# Patient Record
Sex: Male | Born: 1950 | Race: White | Hispanic: No | Marital: Single | Smoking: Former smoker
Health system: Southern US, Community
[De-identification: ages and names within clinical notes are randomized; demographics above are authoritative.]

## PROBLEM LIST (undated history)

## (undated) DIAGNOSIS — A419 Sepsis, unspecified organism: Secondary | ICD-10-CM

## (undated) DIAGNOSIS — C801 Malignant (primary) neoplasm, unspecified: Secondary | ICD-10-CM

## (undated) DIAGNOSIS — K7460 Unspecified cirrhosis of liver: Secondary | ICD-10-CM

## (undated) DIAGNOSIS — B1910 Unspecified viral hepatitis B without hepatic coma: Secondary | ICD-10-CM

---

## 2013-06-07 DIAGNOSIS — A419 Sepsis, unspecified organism: Secondary | ICD-10-CM

## 2013-06-07 HISTORY — DX: Sepsis, unspecified organism: A41.9

## 2013-11-03 ENCOUNTER — Encounter (HOSPITAL_COMMUNITY): Payer: Self-pay | Admitting: Emergency Medicine

## 2013-11-03 ENCOUNTER — Emergency Department (HOSPITAL_COMMUNITY): Payer: Medicaid Other

## 2013-11-03 ENCOUNTER — Inpatient Hospital Stay (HOSPITAL_COMMUNITY): Payer: Medicaid Other

## 2013-11-03 ENCOUNTER — Inpatient Hospital Stay (HOSPITAL_COMMUNITY)
Admission: EM | Admit: 2013-11-03 | Discharge: 2013-11-14 | DRG: 871 | Disposition: A | Discharge status: Other Acute Inpatient Hospital | Payer: Medicaid Other | Attending: Pulmonary Disease | Admitting: Pulmonary Disease

## 2013-11-03 DIAGNOSIS — IMO0002 Reserved for concepts with insufficient information to code with codable children: Secondary | ICD-10-CM | POA: Diagnosis not present

## 2013-11-03 DIAGNOSIS — Z8619 Personal history of other infectious and parasitic diseases: Secondary | ICD-10-CM

## 2013-11-03 DIAGNOSIS — J9819 Other pulmonary collapse: Secondary | ICD-10-CM | POA: Diagnosis present

## 2013-11-03 DIAGNOSIS — R6520 Severe sepsis without septic shock: Secondary | ICD-10-CM

## 2013-11-03 DIAGNOSIS — R7881 Bacteremia: Secondary | ICD-10-CM

## 2013-11-03 DIAGNOSIS — E861 Hypovolemia: Secondary | ICD-10-CM | POA: Diagnosis present

## 2013-11-03 DIAGNOSIS — R092 Respiratory arrest: Secondary | ICD-10-CM | POA: Diagnosis not present

## 2013-11-03 DIAGNOSIS — K8020 Calculus of gallbladder without cholecystitis without obstruction: Secondary | ICD-10-CM | POA: Diagnosis present

## 2013-11-03 DIAGNOSIS — R142 Eructation: Secondary | ICD-10-CM

## 2013-11-03 DIAGNOSIS — J90 Pleural effusion, not elsewhere classified: Secondary | ICD-10-CM | POA: Diagnosis present

## 2013-11-03 DIAGNOSIS — R141 Gas pain: Secondary | ICD-10-CM | POA: Diagnosis present

## 2013-11-03 DIAGNOSIS — K922 Gastrointestinal hemorrhage, unspecified: Secondary | ICD-10-CM

## 2013-11-03 DIAGNOSIS — I319 Disease of pericardium, unspecified: Secondary | ICD-10-CM | POA: Diagnosis present

## 2013-11-03 DIAGNOSIS — R5381 Other malaise: Secondary | ICD-10-CM | POA: Diagnosis present

## 2013-11-03 DIAGNOSIS — C9111 Chronic lymphocytic leukemia of B-cell type in remission: Secondary | ICD-10-CM | POA: Diagnosis present

## 2013-11-03 DIAGNOSIS — R188 Other ascites: Secondary | ICD-10-CM | POA: Diagnosis present

## 2013-11-03 DIAGNOSIS — D61818 Other pancytopenia: Secondary | ICD-10-CM

## 2013-11-03 DIAGNOSIS — E43 Unspecified severe protein-calorie malnutrition: Secondary | ICD-10-CM

## 2013-11-03 DIAGNOSIS — B1910 Unspecified viral hepatitis B without hepatic coma: Secondary | ICD-10-CM | POA: Diagnosis present

## 2013-11-03 DIAGNOSIS — A419 Sepsis, unspecified organism: Secondary | ICD-10-CM

## 2013-11-03 DIAGNOSIS — K769 Liver disease, unspecified: Secondary | ICD-10-CM | POA: Diagnosis present

## 2013-11-03 DIAGNOSIS — N170 Acute kidney failure with tubular necrosis: Secondary | ICD-10-CM | POA: Diagnosis present

## 2013-11-03 DIAGNOSIS — A4159 Other Gram-negative sepsis: Principal | ICD-10-CM | POA: Diagnosis present

## 2013-11-03 DIAGNOSIS — K7460 Unspecified cirrhosis of liver: Secondary | ICD-10-CM | POA: Diagnosis present

## 2013-11-03 DIAGNOSIS — E162 Hypoglycemia, unspecified: Secondary | ICD-10-CM | POA: Diagnosis present

## 2013-11-03 DIAGNOSIS — K658 Other peritonitis: Secondary | ICD-10-CM | POA: Diagnosis present

## 2013-11-03 DIAGNOSIS — D689 Coagulation defect, unspecified: Secondary | ICD-10-CM

## 2013-11-03 DIAGNOSIS — D6959 Other secondary thrombocytopenia: Secondary | ICD-10-CM | POA: Diagnosis present

## 2013-11-03 DIAGNOSIS — R143 Flatulence: Secondary | ICD-10-CM

## 2013-11-03 DIAGNOSIS — Z87891 Personal history of nicotine dependence: Secondary | ICD-10-CM | POA: Diagnosis not present

## 2013-11-03 DIAGNOSIS — R5383 Other fatigue: Secondary | ICD-10-CM | POA: Diagnosis not present

## 2013-11-03 DIAGNOSIS — R579 Shock, unspecified: Secondary | ICD-10-CM

## 2013-11-03 DIAGNOSIS — R6521 Severe sepsis with septic shock: Secondary | ICD-10-CM

## 2013-11-03 HISTORY — DX: Unspecified viral hepatitis B without hepatic coma: B19.10

## 2013-11-03 HISTORY — DX: Unspecified cirrhosis of liver: K74.60

## 2013-11-03 HISTORY — DX: Malignant (primary) neoplasm, unspecified: C80.1

## 2013-11-03 LAB — URINALYSIS, ROUTINE W REFLEX MICROSCOPIC
BILIRUBIN URINE: NEGATIVE
Glucose, UA: NEGATIVE mg/dL
Hgb urine dipstick: NEGATIVE
Ketones, ur: NEGATIVE mg/dL
LEUKOCYTES UA: NEGATIVE
NITRITE: NEGATIVE
PH: 6.5 (ref 5.0–8.0)
Protein, ur: NEGATIVE mg/dL
Specific Gravity, Urine: 1.006 (ref 1.005–1.030)
UROBILINOGEN UA: 1 mg/dL (ref 0.0–1.0)

## 2013-11-03 LAB — COMPREHENSIVE METABOLIC PANEL
ALT: 20 U/L (ref 0–53)
AST: 50 U/L — ABNORMAL HIGH (ref 0–37)
Albumin: 1.6 g/dL — ABNORMAL LOW (ref 3.5–5.2)
Alkaline Phosphatase: 53 U/L (ref 39–117)
BUN: 40 mg/dL — ABNORMAL HIGH (ref 6–23)
CO2: 25 mEq/L (ref 19–32)
Calcium: 7.6 mg/dL — ABNORMAL LOW (ref 8.4–10.5)
Chloride: 87 mEq/L — ABNORMAL LOW (ref 96–112)
Creatinine, Ser: 1.47 mg/dL — ABNORMAL HIGH (ref 0.50–1.35)
GFR calc Af Amer: 56 mL/min — ABNORMAL LOW (ref >90)
GFR calc non Af Amer: 49 mL/min — ABNORMAL LOW (ref >90)
Glucose, Bld: 67 mg/dL — ABNORMAL LOW (ref 70–99)
Potassium: 4.5 mEq/L (ref 3.7–5.3)
Sodium: 130 mEq/L — ABNORMAL LOW (ref 137–147)
Total Bilirubin: 3.1 mg/dL — ABNORMAL HIGH (ref 0.3–1.2)
Total Protein: 3.5 g/dL — ABNORMAL LOW (ref 6.0–8.3)

## 2013-11-03 LAB — CBC WITH DIFFERENTIAL/PLATELET
Basophils Absolute: 0 10*3/uL (ref 0.0–0.1)
Basophils Relative: 1 % (ref 0–1)
Eosinophils Absolute: 0 10*3/uL (ref 0.0–0.7)
Eosinophils Relative: 0 % (ref 0–5)
HCT: 45.5 % (ref 39.0–52.0)
Hemoglobin: 15.8 g/dL (ref 13.0–17.0)
Lymphocytes Relative: 8 % — ABNORMAL LOW (ref 12–46)
Lymphs Abs: 0.2 10*3/uL — ABNORMAL LOW (ref 0.7–4.0)
MCH: 31.8 pg (ref 26.0–34.0)
MCHC: 34.7 g/dL (ref 30.0–36.0)
MCV: 91.5 fL (ref 78.0–100.0)
Monocytes Absolute: 0.5 10*3/uL (ref 0.1–1.0)
Monocytes Relative: 24 % — ABNORMAL HIGH (ref 3–12)
Neutro Abs: 1.4 10*3/uL — ABNORMAL LOW (ref 1.7–7.7)
Neutrophils Relative %: 67 % (ref 43–77)
Platelets: 102 10*3/uL — ABNORMAL LOW (ref 150–400)
RBC: 4.97 MIL/uL (ref 4.22–5.81)
RDW: 15.7 % — ABNORMAL HIGH (ref 11.5–15.5)
WBC: 2.1 10*3/uL — ABNORMAL LOW (ref 4.0–10.5)

## 2013-11-03 LAB — CBC
HCT: 39 % (ref 39.0–52.0)
Hemoglobin: 13.7 g/dL (ref 13.0–17.0)
MCH: 32 pg (ref 26.0–34.0)
MCHC: 35.1 g/dL (ref 30.0–36.0)
MCV: 91.1 fL (ref 78.0–100.0)
PLATELETS: 77 10*3/uL — AB (ref 150–400)
RBC: 4.28 MIL/uL (ref 4.22–5.81)
RDW: 16.1 % — ABNORMAL HIGH (ref 11.5–15.5)
WBC: 1.3 10*3/uL — CL (ref 4.0–10.5)

## 2013-11-03 LAB — PHOSPHORUS: PHOSPHORUS: 4.7 mg/dL — AB (ref 2.3–4.6)

## 2013-11-03 LAB — I-STAT CG4 LACTIC ACID, ED: Lactic Acid, Venous: 5.23 mmol/L — ABNORMAL HIGH (ref 0.5–2.2)

## 2013-11-03 LAB — MRSA PCR SCREENING: MRSA by PCR: NEGATIVE

## 2013-11-03 LAB — PROTIME-INR
INR: 1.8 — ABNORMAL HIGH (ref 0.00–1.49)
Prothrombin Time: 20.4 seconds — ABNORMAL HIGH (ref 11.6–15.2)

## 2013-11-03 LAB — TROPONIN I
Troponin I: 1.1 ng/mL (ref <0.30)
Troponin I: 1.29 ng/mL (ref <0.30)

## 2013-11-03 LAB — GLUCOSE, CAPILLARY
GLUCOSE-CAPILLARY: 100 mg/dL — AB (ref 70–99)
Glucose-Capillary: 100 mg/dL — ABNORMAL HIGH (ref 70–99)

## 2013-11-03 LAB — AMMONIA: Ammonia: 28 umol/L (ref 11–60)

## 2013-11-03 LAB — MAGNESIUM: Magnesium: 1.8 mg/dL (ref 1.5–2.5)

## 2013-11-03 LAB — CBG MONITORING, ED
Glucose-Capillary: 53 mg/dL — ABNORMAL LOW (ref 70–99)
Glucose-Capillary: 112 mg/dL — ABNORMAL HIGH (ref 70–99)

## 2013-11-03 LAB — ABO/RH: ABO/RH(D): O NEG

## 2013-11-03 LAB — PREPARE RBC (CROSSMATCH)

## 2013-11-03 LAB — LACTIC ACID, PLASMA
Lactic Acid, Venous: 5.4 mmol/L — ABNORMAL HIGH (ref 0.5–2.2)
Lactic Acid, Venous: 4.3 mmol/L — ABNORMAL HIGH (ref 0.5–2.2)

## 2013-11-03 LAB — PROCALCITONIN: Procalcitonin: 7.05 ng/mL

## 2013-11-03 MED ORDER — PIPERACILLIN-TAZOBACTAM 3.375 G IVPB
3.3750 g | Freq: Three times a day (TID) | INTRAVENOUS | Status: DC
  Administered 2013-11-03 – 2013-11-06 (×8): 3.375 g via INTRAVENOUS
  Filled 2013-11-03 (×10): qty 50

## 2013-11-03 MED ORDER — VANCOMYCIN HCL IN DEXTROSE 1-5 GM/200ML-% IV SOLN
1000.0000 mg | Freq: Once | INTRAVENOUS | Status: AC
  Administered 2013-11-03: 1000 mg via INTRAVENOUS
  Filled 2013-11-03: qty 200

## 2013-11-03 MED ORDER — SODIUM CHLORIDE 0.9 % IV SOLN
50.0000 ug/h | INTRAVENOUS | Status: DC
  Administered 2013-11-03 – 2013-11-04 (×3): 50 ug/h via INTRAVENOUS
  Filled 2013-11-03 (×6): qty 1

## 2013-11-03 MED ORDER — ALBUMIN HUMAN 25 % IV SOLN
25.0000 g | Freq: Once | INTRAVENOUS | Status: AC
  Administered 2013-11-03: 25 g via INTRAVENOUS
  Filled 2013-11-03: qty 100

## 2013-11-03 MED ORDER — DEXTROSE 5 % IV SOLN
2.0000 ug/min | INTRAVENOUS | Status: DC
  Filled 2013-11-03 (×2): qty 4

## 2013-11-03 MED ORDER — DEXTROSE 5 % IV SOLN: 1.0000 g | Freq: Once | INTRAVENOUS | Status: DC

## 2013-11-03 MED ORDER — NOREPINEPHRINE BITARTRATE 1 MG/ML IV SOLN
2.0000 ug/min | Freq: Once | INTRAVENOUS | Status: AC
  Administered 2013-11-03: 2 ug/min via INTRAVENOUS
  Filled 2013-11-03 (×2): qty 4

## 2013-11-03 MED ORDER — CEFEPIME HCL 2 G IJ SOLR: 2.0000 g | INTRAMUSCULAR | Status: DC

## 2013-11-03 MED ORDER — DEXTROSE 5 % IV SOLN: 2.0000 g | INTRAVENOUS | Status: DC

## 2013-11-03 MED ORDER — SODIUM CHLORIDE 0.9 % IV BOLUS (SEPSIS)
2000.0000 mL | Freq: Once | INTRAVENOUS | Status: AC
  Administered 2013-11-03: 2000 mL via INTRAVENOUS

## 2013-11-03 MED ORDER — SODIUM CHLORIDE 0.9 % IV SOLN: INTRAVENOUS | Status: DC

## 2013-11-03 MED ORDER — VANCOMYCIN HCL 500 MG IV SOLR
500.0000 mg | Freq: Two times a day (BID) | INTRAVENOUS | Status: DC
  Administered 2013-11-04 – 2013-11-05 (×3): 500 mg via INTRAVENOUS
  Filled 2013-11-03 (×5): qty 500

## 2013-11-03 MED ORDER — DEXTROSE-NACL 5-0.45 % IV SOLN
INTRAVENOUS | Status: DC
  Administered 2013-11-03 – 2013-11-04 (×2): via INTRAVENOUS

## 2013-11-03 MED ORDER — PIPERACILLIN-TAZOBACTAM 3.375 G IVPB 30 MIN
3.3750 g | Freq: Once | INTRAVENOUS | Status: AC
  Administered 2013-11-03: 3.375 g via INTRAVENOUS
  Filled 2013-11-03: qty 50

## 2013-11-03 MED ORDER — SODIUM CHLORIDE 0.9 % IV SOLN
8.0000 mg/h | INTRAVENOUS | Status: DC
  Administered 2013-11-03 – 2013-11-04 (×3): 8 mg/h via INTRAVENOUS
  Filled 2013-11-03 (×6): qty 80

## 2013-11-03 MED ORDER — PANTOPRAZOLE SODIUM 40 MG IV SOLR
80.0000 mg | Freq: Once | INTRAVENOUS | Status: AC
  Administered 2013-11-03: 80 mg via INTRAVENOUS
  Filled 2013-11-03: qty 80

## 2013-11-03 MED ORDER — OCTREOTIDE LOAD VIA INFUSION
50.0000 ug | Freq: Once | INTRAVENOUS | Status: AC
  Administered 2013-11-03: 50 ug via INTRAVENOUS
  Filled 2013-11-03: qty 25

## 2013-11-03 MED ORDER — SODIUM CHLORIDE 0.9 % IV BOLUS (SEPSIS)
1000.0000 mL | Freq: Once | INTRAVENOUS | Status: AC
  Administered 2013-11-03: 1000 mL via INTRAVENOUS

## 2013-11-03 MED ORDER — DEXTROSE 50 % IV SOLN
INTRAVENOUS | Status: AC
  Administered 2013-11-03: 50 mL
  Filled 2013-11-03: qty 50

## 2013-11-03 NOTE — ED Notes (Signed)
Tyler Huang  And I put in a temp foley

## 2013-11-03 NOTE — ED Notes (Addendum)
Sister Claudius Sis [phone] please call if needed.

## 2013-11-03 NOTE — Progress Notes (Signed)
PRELIMINARY NOTE:  Pt seen briefly, and labs/CT reviewed.  It does not appear this pt has an active GIB to acct for his malaise and hypotension.  Agree w/ mgt per PCCM.    Will see pt in more detail later today.  Call in the meantime if needed.  Cleotis Nipper, M.D. [phone]

## 2013-11-03 NOTE — ED Notes (Signed)
Attempting Second IV and blood draw

## 2013-11-03 NOTE — ED Notes (Signed)
DR. Earnest Conroy at bedside.

## 2013-11-03 NOTE — ED Notes (Signed)
GI Dr at bedside

## 2013-11-03 NOTE — ED Notes (Addendum)
Pt from home via [facility] c/o nausea and vomiting since last night. HX of lukemia, cirrhosis, Hep B. Pt was hypotensive with EMS at 87/66. Pt alert and oriented. Emesis is brown in color. PT denies pain. Pt had 4mg  Zofran en route and 500 mL Bolus

## 2013-11-03 NOTE — ED Notes (Signed)
Phlebotomy at bedside.

## 2013-11-03 NOTE — ED Notes (Signed)
MD at bedside. 

## 2013-11-03 NOTE — ED Notes (Signed)
Phlebotomy called for labs 

## 2013-11-03 NOTE — ED Notes (Signed)
Bed: VZ85 Expected date:  Expected time:  Means of arrival:  Comments: EMS cancer/vomiting/weak

## 2013-11-03 NOTE — ED Notes (Signed)
Notified Dr Wilson Singer lactic acid on Res-b 5.23.Marland KitchenKLJ

## 2013-11-03 NOTE — ED Provider Notes (Signed)
CSN: [id number]     Arrival date & time 11/03/13  0754 History   First MD Initiated Contact with Patient 11/03/13 0800     Chief Complaint  Patient presents with  . Weakness  . Emesis     (Consider location/radiation/quality/duration/timing/severity/associated sxs/prior Treatment) HPI  62yM with generalized weakness and n/v. Pt from near [place], [place]. No old records for review. 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 "It's not from alcohol." Per sister, from hep. B.  Has  gastroenterologist, but not in this area. Reports getting treatment, but cannot provide specifics.  Last night began having n/v. Initially was protein shakes his sister was making for him, but then became black/brown. Feels very weak and off balance. No BRPR or melena. Mild SOB. Denies any acute pain. No varices that he is aware of.    Past Medical History  Diagnosis Date  . Cancer   . Hepatitis B   . Cirrhosis    No past surgical history on file. No family history on file. History  Substance Use Topics  . Smoking status: Former Smoker    Types: Cigarettes  . Smokeless tobacco: Not on file  . Alcohol Use: No    Review of Systems  All systems reviewed and negative, other than as noted in HPI.   Allergies  Review of patient's allergies indicates no known allergies.  Home Medications   Prior to Admission medications   Not on File   BP 70/50  Pulse 102  Resp 16  Ht 6' (1.829 m)  Wt 147 lb (66.679 kg)  BMI 19.93 kg/m2  SpO2 91% Physical Exam  Nursing note and vitals reviewed. Constitutional: He appears distressed.  Cachectic and chronically ill appearing. Frequently spitting up small amount of dark brown/black material.  Eyes: Right eye exhibits no discharge. Left eye exhibits no discharge. Scleral icterus is present.  Neck: Neck supple.  Cardiovascular: Normal rate, regular rhythm and normal heart sounds.  Exam reveals no gallop and  no friction rub.   No murmur heard. Pulmonary/Chest:  Tachypnea. Lung sounds clear.   Abdominal: Soft. He exhibits distension. There is no tenderness.  Soft. Distended. Nontender.   Genitourinary:  Brown colored stool. Heme positive.   Musculoskeletal: He exhibits no edema and no tenderness.  Neurological: He is alert.  Skin: Skin is warm and dry.  Psychiatric: He has a normal mood and affect. His behavior is normal. Thought content normal.    ED Course  Procedures (including critical care time)  CENTRAL LINE Performed by: Virgel Manifold Consent: The procedure was performed in an emergent situation. Required items: required blood products, implants, devices, and special equipment available Patient identity confirmed: arm band and provided demographic data Time out: Immediately prior to procedure a "time out" was called to verify the correct patient, procedure, equipment, support staff and site/side marked as required. Indications: vascular access Anesthesia: local infiltration Local anesthetic: lidocaine 1% w/o epinephrine Anesthetic total: 3 ml Patient sedated: no Preparation: skin prepped with 2% chlorhexidine Skin prep agent dried: skin prep agent completely dried prior to procedure Sterile barriers: all five maximum sterile barriers used - cap, mask, sterile gown, sterile gloves, and large sterile sheet Hand hygiene: hand hygiene performed prior to central venous catheter insertion  Location details: R IJ  Catheter type: triple lumen Catheter size: 8 Fr Pre-procedure: landmarks identified Ultrasound guidance: yes Successful placement: yes. 20cm TLC placed at 15cm. Post-procedure: line sutured and dressing applied Assessment:  blood return through all parts, free fluid flow, placement verified by x-ray and no pneumothorax on x-ray Patient tolerance: Patient tolerated the procedure well with no immediate complications.   CRITICAL CARE Performed by: Virgel Manifold  Total  critical care time: 40 minutes  Critical care time was exclusive of separately billable procedures and treating other patients. Critical care was necessary to treat or prevent imminent or life-threatening deterioration. Critical care was time spent personally by me on the following activities: development of treatment plan with patient and/or surrogate as well as nursing, discussions with consultants, evaluation of patient's response to treatment, examination of patient, obtaining history from patient or surrogate, ordering and performing treatments and interventions, ordering and review of laboratory studies, ordering and review of radiographic studies, pulse oximetry and re-evaluation of patient's condition.   Labs Review Labs Reviewed  CBC WITH DIFFERENTIAL - Abnormal; Notable for the following:    WBC 2.1 (*)    RDW 15.7 (*)    Platelets 102 (*)    All other components within normal limits  COMPREHENSIVE METABOLIC PANEL - Abnormal; Notable for the following:    Sodium 130 (*)    Chloride 87 (*)    Glucose, Bld 67 (*)    BUN 40 (*)    Creatinine, Ser 1.47 (*)    Calcium 7.6 (*)    Total Protein 3.5 (*)    Albumin 1.6 (*)    AST 50 (*)    Total Bilirubin 3.1 (*)    GFR calc non Af Amer 49 (*)    GFR calc Af Amer 56 (*)    All other components within normal limits  PROTIME-INR - Abnormal; Notable for the following:    Prothrombin Time 20.4 (*)    INR 1.80 (*)    All other components within normal limits  CULTURE, BLOOD (ROUTINE X 2)  CULTURE, BLOOD (ROUTINE X 2)  AMMONIA  URINALYSIS, ROUTINE W REFLEX MICROSCOPIC  I-STAT CG4 LACTIC ACID, ED  TYPE AND SCREEN  PREPARE RBC (CROSSMATCH)    Imaging Review Dg Chest Portable 1 View  11/03/2013   CLINICAL DATA:  Status post right jugular line placement  EXAM: PORTABLE CHEST - 1 VIEW  COMPARISON:  11/03/2013  FINDINGS: Cardiac shadow is stable. Poor inspiratory effort is again noted with bibasilar atelectatic changes. A new right  jugular line is noted with the catheter tip in the superior aspect of the right atrium. This is likely somewhat accentuated by the poor inspiratory effort. It could be withdrawn 1 cm as needed. No pneumothorax is noted.  IMPRESSION: Status post central line placement. This could be withdrawn 1 cm for more appropriate placement at the cavoatrial junction.  Bibasilar atelectatic changes.   Electronically Signed   By: Inez Catalina M.D.   On: 11/03/2013 10:50   Dg Chest Portable 1 View  11/03/2013   CLINICAL DATA:  Nausea/vomiting, weakness, cirrhosis  EXAM: PORTABLE CHEST - 1 VIEW  COMPARISON:  None.  FINDINGS: Low lung volumes. Mild left basilar opacity, likely atelectasis. No focal consolidation. No pleural effusion or pneumothorax.  Heart is normal size.  IMPRESSION: Low lung volumes with left basilar atelectasis.   Electronically Signed   By: Julian Hy M.D.   On: 11/03/2013 10:12     EKG Interpretation None      MDM   Final diagnoses:  Upper GI bleed  Cirrhosis  Shock circulatory    63 year old male with nausea vomiting and generalized weakness. Patient appears ill. Hypotensive and mildly tachycardic.  Likely upper GI bleed. Frequently spitting up black material. History of cirrhosis. No known history of varices.  IV access x2 established. IV fluid bolus. Protonix bolus and drip. Type and screen. Will assess response to IV fluids. May potentially need transfusion. No blood thinners, but unclear of synthetic function of liver with his history of cirrhosis. We'll check an INR. GI consultation. Admission.  Remains hypotensive after 3L IVF. PRBCs ordered. Will place central line.   H/H actually came back normal. Wouldn't be surprised if has subsequent drop. Abx ordered for possible sepsis, although clinically probably not. GI, Dr Cristina Gong has been consulted. Recommending starting octreotide and will see in consultation. Will discuss with CCM.   Line placed. Levophed. May benefit from  albumin.   Virgel Manifold, MD 11/08/13 [phone]

## 2013-11-03 NOTE — ED Notes (Signed)
Patient is trying to urinate, but if unable, I will get it once the nurse and I put in a temp foley.

## 2013-11-03 NOTE — ED Notes (Signed)
Patient wants wait and to try to urinate again. Will call once he has urinated

## 2013-11-03 NOTE — Consult Note (Signed)
Referring Provider: Dr. Mila Merry  Primary Care Physician:  Althia Forts Primary Gastroenterologist: Althia Forts  Reason for Consultation:  Cirrhosis and hypotension, questionable bleeding.  HPI: Tyler Huang is a 63 y.o. male admitted through the emergency room today with nausea and vomiting that began early this morning. In the emergency room, he had low blood pressure, in the 60 systolic range, and received several liters of fluid with only modest improvement. He had some dark emesis but no frank blood. His stool from below, in the emergency room, was brown but Hemoccult positive.   The patient has only been living [place] about 6 months, and we have no outside records currently available for review. 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 He was subsequently seen by a gastroenterologist in [place], Dr. Drema Dallas , who has arrange for the patient had a liver transplant evaluation at [facility] on June 19, according to the patient.  With that background, the patient indicates that he has been going steadily downhill since his November hospitalization, with progressive weakness and weight loss as well as a tendency for reaccumulation of his ascites which compresses his stomach and diminishes his appetite. He has been on diuretic therapy.   In the emergency room, his hemoglobin was normal at 14.5, ammonia was normal, his bilirubin was 3.1, his sodium was 130, BUN and creatinine were moderately elevated at 40 and 1.47, respectively, liver chemistries essentially normal, platelets low at 74,000, and CT showed gallstones and changes of cirrhosis with ascites.   Past Medical History  Diagnosis Date  . Cancer   .  Hepatitis B   . Cirrhosis     No past surgical history on file.  Prior to Admission medications   Medication Sig Start Date End Date Taking? Authorizing Provider  predniSONE (DELTASONE) 10 MG tablet Take 10 mg by mouth 2 (two) times a week.   Yes Historical Provider, MD  spironolactone (ALDACTONE) 50 MG tablet Take 50 mg by mouth daily.   Yes Historical Provider, MD  torsemide (DEMADEX) 20 MG tablet Take 20 mg by mouth daily.   Yes Historical Provider, MD    Current Facility-Administered Medications  Medication Dose Route Frequency Provider Last Rate Last Dose  . dextrose 5 %-0.45 % sodium chloride infusion   Intravenous Continuous Doree Fudge, MD 100 mL/hr at 11/03/13 1248    . norepinephrine (LEVOPHED) 4 mg in dextrose 5 % 250 mL infusion  2-50 mcg/min Intravenous Continuous Konstantin Zubelevitskiy, MD      . octreotide (SANDOSTATIN) 2 mcg/mL in sodium chloride 0.9 % 250 mL infusion  50 mcg/hr Intravenous Continuous Virgel Manifold, MD 25 mL/hr at 11/03/13 1051 50 mcg/hr at 11/03/13 1051  . pantoprazole (PROTONIX) 80 mg in sodium chloride 0.9 % 250 mL infusion  8 mg/hr Intravenous Continuous Virgel Manifold, MD 25 mL/hr at 11/03/13 0930 8 mg/hr at 11/03/13 0930  . piperacillin-tazobactam (ZOSYN) IVPB 3.375 g  3.375 g Intravenous [address] [place]      . [START ON 11/04/2013] vancomycin (VANCOCIN) 500 mg in sodium chloride 0.9 % 100 mL IVPB  500 mg Intravenous Q12H [facility]        Allergies as of 11/03/2013  . (No Known Allergies)  No family history on file.  History   Social History  . Marital Status: Single    Spouse Name: N/A    Number of Children: N/A  . Years of Education: N/A   Occupational History  . Not on file.   Social History Main Topics  . Smoking status: Former Smoker    Types: Cigarettes  . Smokeless tobacco: Not on file  . Alcohol Use: No  . Drug Use: No  . Sexual Activity: Not on file   Other Topics Concern  .  Not on file   Social History Narrative  . No narrative on file    Review of Systems:  anorexia, weight loss. No focus of infection such as cough, no dyspnea or chest pain   Physical Exam: Vital signs in last 24 hours: Temp:  [94.1 F (34.5 C)-97.2 F (36.2 C)] 96 F (35.6 C) (05/30 1345) Pulse Rate:  [44-153] 111 (05/30 1500) Resp:  [16-42] 40 (05/30 1500) BP: (65-109)/(18-71) 83/66 mmHg (05/30 1500) SpO2:  [69 %-98 %] 98 % (05/30 1500) Weight:  [66.679 kg (147 lb)] 66.679 kg (147 lb) (05/30 0804)   Of thin, malnourished Caucasian male with multiple ecchymoses on his forearms. No frank jaundice or scleral icterus. No spider angiomata or palmar erythema. No peripheral edema, but moderately severe ascites. Liver span diminished I scratched S., probably 7 cm, liver and spleen nonpalpable, generous ascites present. Appears cognitively intact, no asterixis. Chest clear, heart has rapid rate but no murmur or irregularity of rhythm. No umbilical hernia present. Stool by rectal exam was brown and Hemoccult positive, per the emergency room physician.   Intake/Output from previous day:   Intake/Output this shift:    Lab Results:  Recent Labs  11/03/13 0916 11/03/13 1545  WBC 2.1* 1.3*  HGB 15.8 14.5  HCT 45.5 41.3  PLT 102* 74*   BMET  Recent Labs  11/03/13 0916  NA 130*  K 4.5  CL 87*  CO2 25  GLUCOSE 67*  BUN 40*  CREATININE 1.47*  CALCIUM 7.6*   LFT  Recent Labs  11/03/13 0916  PROT 3.5*  ALBUMIN 1.6*  AST 50*  ALT 20  ALKPHOS 53  BILITOT 3.1*   PT/INR  Recent Labs  11/03/13 0916  LABPROT 20.4*  INR 1.80*    Studies/Results: Ct Abdomen Pelvis Wo Contrast  11/03/2013   CLINICAL DATA:  Nausea and vomiting, hypotension, cirrhosis  EXAM: CT ABDOMEN AND PELVIS WITHOUT CONTRAST  TECHNIQUE: Multidetector CT imaging of the abdomen and pelvis was performed following the standard protocol without IV contrast.  COMPARISON:  None.  FINDINGS: Moderate-sized left  basilar effusion is noted. Associated infiltrate is noted in the left lower lobe.  There are cirrhotic changes of the liver with nodularity and a small size. The spleen is mildly enlarged. Diffuse ascites is noted throughout the abdomen. Few small gallstones are seen. The adrenal glands and kidneys are within normal limits. The pancreas is within normal limits as well. The bladder is well distended. No pelvic mass lesion is noted. The appendix is not well visualized although no inflammatory changes are seen. The osseous structures are grossly unremarkable. The wall the distal esophagus is thickened which may be related to esophageal varices. The lack of IV contrast makes vascular evaluation difficult.  IMPRESSION: Changes consistent with cirrhosis of the liver and portal hypertension. Significant large volume ascites is noted.  Small gallstones without complicating factors.  Left pleural effusion and infiltrate   Electronically Signed   By:  Inez Catalina M.D.   On: 11/03/2013 12:05   Dg Chest Portable 1 View  11/03/2013   CLINICAL DATA:  Status post right jugular line placement  EXAM: PORTABLE CHEST - 1 VIEW  COMPARISON:  11/03/2013  FINDINGS: Cardiac shadow is stable. Poor inspiratory effort is again noted with bibasilar atelectatic changes. A new right jugular line is noted with the catheter tip in the superior aspect of the right atrium. This is likely somewhat accentuated by the poor inspiratory effort. It could be withdrawn 1 cm as needed. No pneumothorax is noted.  IMPRESSION: Status post central line placement. This could be withdrawn 1 cm for more appropriate placement at the cavoatrial junction.  Bibasilar atelectatic changes.   Electronically Signed   By: Inez Catalina M.D.   On: 11/03/2013 10:50   Dg Chest Portable 1 View  11/03/2013   CLINICAL DATA:  Nausea/vomiting, weakness, cirrhosis  EXAM: PORTABLE CHEST - 1 VIEW  COMPARISON:  None.  FINDINGS: Low lung volumes. Mild left basilar opacity, likely  atelectasis. No focal consolidation. No pleural effusion or pneumothorax.  Heart is normal size.  IMPRESSION: Low lung volumes with left basilar atelectasis.   Electronically Signed   By: Julian Hy M.D.   On: 11/03/2013 10:12    Impression:  1. Hypotension, nausea and vomiting, azotemia and electrolyte abnormalities. I question whether this could be volume contraction related to over-diuresis, with nausea and vomiting as a consequence. There is no evidence of clinically significant GI bleeding to account for the patient's hypotension. 2. Heme positive stool, without overt bleeding or anemia. 3. Chronic liver disease with decompensation characterized by ascites and mild hyperbilirubinemia, possibly also by renal dysfunction although that may be acute and reversible.  Plan:  1. Fluid and electrolyte repletion as being performed by PCCM. 2. Empiric octreotide for now, although I think we will be able to discontinue it in the next day or 2 unless evidence of bleeding develops. 3. Agree with antibiotic therapy. It is conceivable this patient has sepsis from some source. Also, he could have SBP, although in my experience, and that is not a cause for sepsis and hypotension. 4. Ultimately, the patient will need liver transplant evaluation and possibly care for his hepatitis B at a transplant center. 5. Prior to discharge, if his volume status is corrected, it might be appropriate to do a therapeutic paracentesis. 6. I see no urgent need for endoscopy and colonoscopy at present, but eventually, they should be performed in view of his heme positivity.    LOS: 0 days   Cleotis Nipper  11/03/2013, 4:33 PM

## 2013-11-03 NOTE — ED Notes (Signed)
CBG registered 112 on ED Glucometer.

## 2013-11-03 NOTE — Progress Notes (Addendum)
ANTIBIOTIC CONSULT NOTE - INITIAL  Pharmacy Consult for vancomycin, Zosyn Indication: rule out sepsis  No Known Allergies  Patient Measurements: Height: 6' (182.9 cm) Weight: 147 lb (66.679 kg) IBW/kg (Calculated) : 77.6  Vital Signs: Temp: 97.2 F (36.2 C) (05/30 0841) Temp src: Rectal (05/30 0841) BP: 80/44 mmHg (05/30 1022) Pulse Rate: 91 (05/30 1022) Intake/Output from previous day:   Intake/Output from this shift:    Labs:  Recent Labs  11/03/13 0916  WBC 2.1*  HGB 15.8  PLT 102*  CREATININE 1.47*   Estimated Creatinine Clearance: 47.9 ml/min (by C-G formula based on Cr of 1.47). No results found for this basename: VANCOTROUGH, VANCOPEAK, VANCORANDOM, GENTTROUGH, GENTPEAK, GENTRANDOM, TOBRATROUGH, TOBRAPEAK, TOBRARND, AMIKACINPEAK, AMIKACINTROU, AMIKACIN,  in the last 72 hours   Microbiology: No results found for this or any previous visit (from the past 720 hour(s)).  Medical History: Past Medical History  Diagnosis Date  . Cancer   . Hepatitis B   . Cirrhosis     Medications:  Scheduled:  . norepinephrine (LEVOPHED) Adult infusion  2-50 mcg/min Intravenous Once  . octreotide  50 mcg Intravenous Once   Infusions:  . octreotide (SANDOSTATIN) infusion    . pantoprozole (PROTONIX) infusion 8 mg/hr (11/03/13 0930)  . sodium chloride    . vancomycin     Assessment: 63 yo male presents to ER with weakness, emesis, hypotension. PMH includes CLL (per H&P last chemo July 2014), cirrhosis, hepatitis B. Patient with likely upper GI bleed per notes as spitting up black material frequently. Also starting vancomycin and Zosyn for possible sepsis per pharmacy dosing. Vancomycin x 1 dose ordered in ER. Baseline labs/vitals: Scr 1.47 with est CrCl 48 ml/min, afebrile  Goal of Therapy:  cefepime adj per renal function, vancomycin trough 15-20 mcg/mL  Plan:  1) Vancomycin 1g IV x 1 in ER then 500mg  IV q12 per current renal function and weight  2) Zosyn 3.375g IV  q8 (extended interval infusion)   Adrian Saran, PharmD, BCPS Pager [phone] 11/03/2013 10:40 AM

## 2013-11-03 NOTE — ED Notes (Signed)
Patient will try to urinate, but may be unable to.

## 2013-11-03 NOTE — ED Notes (Signed)
Pt does not want catheter at this time. He states " I would like to wait on that".

## 2013-11-03 NOTE — H&P (Signed)
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 Hypotensive, requiring CVL placement and Levophed. Noted braun emesis, no overt hematemesis.  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 Hypotensive, requiring CVL placement and Levophed. Noted braun emesis, no overt hematemesis.  At home multiple episodes of non bloody non bilious emesis.  Denies significant abdominal pain, but reports some abdominal distension.  Denies respiratory symptoms.  PAST MEDICAL HISTORY :  Past Medical History  Diagnosis Date  . Cancer   . Hepatitis B   . Cirrhosis    No past surgical history on file. Prior to Admission medications   Not on File   No Known Allergies  FAMILY HISTORY:  No family history on file.  SOCIAL HISTORY:  reports that he has quit smoking. His smoking use included Cigarettes. He smoked 0.00 packs per day. He does not have any smokeless tobacco history on file. He reports that he does not drink alcohol or use illicit drugs.  REVIEW OF SYSTEMS:  Unable to  provide.  INTERVAL HISTORY:  VITAL SIGNS: Temp:  [97.2 F (36.2 C)] 97.2 F (36.2 C) (05/30 0841) Pulse Rate:  [91-153] 102 (05/30 1049) Resp:  [16-42] 40 (05/30 1102) BP: (68-105)/(40-59) 105/59 mmHg (05/30 1102) SpO2:  [69 %-96 %] 96 % (05/30 1049) Weight:  [66.679 kg (147 lb)] 66.679 kg (147 lb) (05/30 0804)  HEMODYNAMICS:   VENTILATOR SETTINGS:   INTAKE / OUTPUT: Intake/Output   None    PHYSICAL EXAMINATION: General:  Appears acutely ill Neuro:  Awake, alert HEENT:  Temporal wasting, dry membranes, saliva with small amount of blood Cardiovascular:  Tachycardic, regular Lungs:  Bilateral air entry, no added sounds Abdomen:  Soft, distended, mild generalized tenderness Musculoskeletal:  Moves all extremities, no edema Skin:  Multiple ecchymoses   LABS: CBC  Recent Labs Lab 11/03/13 0916  WBC 2.1*  HGB 15.8  HCT 45.5  PLT 102*   Coag's  Recent Labs Lab 11/03/13 0916  INR 1.80*   BMET  Recent Labs Lab 11/03/13 0916  NA 130*  K 4.5  CL 87*  CO2 25  BUN 40*  CREATININE 1.47*  GLUCOSE 67*   Electrolytes  Recent Labs Lab 11/03/13 0916  CALCIUM 7.6*   Sepsis Markers No results found for this basename: LATICACIDVEN, PROCALCITON, O2SATVEN,  in the last 168 hours ABG No results found for this basename: PHART, PCO2ART, PO2ART,  in the last 168 hours Liver Enzymes  Recent Labs Lab 11/03/13 0916  AST 50*  ALT 20  ALKPHOS  53  BILITOT 3.1*  ALBUMIN 1.6*   Cardiac Enzymes No results found for this basename: TROPONINI, PROBNP,  in the last 168 hours Glucose No results found for this basename: GLUCAP,  in the last 168 hours  IMAGING: Dg Chest Portable 1 View  11/03/2013   CLINICAL DATA:  Status post right jugular line placement  EXAM: PORTABLE CHEST - 1 VIEW  COMPARISON:  11/03/2013  FINDINGS: Cardiac shadow is stable. Poor inspiratory effort is again noted with bibasilar atelectatic changes. A new right jugular line is noted with the  catheter tip in the superior aspect of the right atrium. This is likely somewhat accentuated by the poor inspiratory effort. It could be withdrawn 1 cm as needed. No pneumothorax is noted.  IMPRESSION: Status post central line placement. This could be withdrawn 1 cm for more appropriate placement at the cavoatrial junction.  Bibasilar atelectatic changes.   Electronically Signed   By: Inez Catalina M.D.   On: 11/03/2013 10:50   Dg Chest Portable 1 View  11/03/2013   CLINICAL DATA:  Nausea/vomiting, weakness, cirrhosis  EXAM: PORTABLE CHEST - 1 VIEW  COMPARISON:  None.  FINDINGS: Low lung volumes. Mild left basilar opacity, likely atelectasis. No focal consolidation. No pleural effusion or pneumothorax.  Heart is normal size.  IMPRESSION: Low lung volumes with left basilar atelectasis.   Electronically Signed   By: Julian Hy M.D.   On: 11/03/2013 10:12   ASSESSMENT / PLAN:  PULMONARY A:   Lung hypoinflation from abdominal distension P:   Supplemental oxygen PRN  CARDIOVASCULAR A:  Shock ( septic, hypovolemic, hemorrhagic ) P:  Goal MAP>65 Trend troponin / lactate Levophed gtt TTE  RENAL A:   AKI Hypovolemia in setting of emesis / diuretics At risk for electrolyte disturbances P:   Goal CVP>10 Received NS 1071mL x 3 and Albumin 25g x 1 Bolus NS to goal CVP D5 1/2 NS @ 100 as hypoglycemic Trend BMP Check Mg, Phos Hold diuretics  GASTROINTESTINAL A:   Hep B related cirrhosis Nausea / vomiting ( without overt hematemesis ) Suspected GI hemorrhage, but doubt significant amount to cause hemodynamic compromise Intraabdominal source of sepsis? P:   NPO Protonix bolus / gtt Octreotide bolus / gtt GI consulted by EDP ( Buccini 5/30 ) Abdomen CT  HEMATOLOGIC A:   Thrombocytopenia >>> hypersplenism? sepsis? leukemia? bone marrow failure? Leucopenia >>> sepsis? leukemia? bone marrow failure? H/c CLL 2014 s/p chemo, in remission Coagulopathy, hepatic VTE Ps P:  Trend  CBC / INR SCDs  INFECTIOUS A:   Suspected intraabdominal sepsis SBP? P:   Cx / abx as above PCT  ENDOCRINE  A:   Hypoglycemia in setting of liver disease  Unknown adrenal function P:   CBG q4h D5 in IVF Cortisol level  NEUROLOGIC A:   No active isses P:   No intervention required  I have personally obtained history, examined patient, evaluated and interpreted laboratory and imaging results, reviewed medical records, formulated assessment / plan and placed orders.  CRITICAL CARE:  The patient is critically ill with multiple organ systems failure and requires high complexity decision making for assessment and support, frequent evaluation and titration of therapies, application of advanced monitoring technologies and extensive interpretation of multiple databases. Critical Care Time devoted to patient care services described in this note is 60 minutes.   Doree Fudge, MD Pulmonary and [place] Pager: [phone]  11/03/2013, 11:19 AM

## 2013-11-04 DIAGNOSIS — J9600 Acute respiratory failure, unspecified whether with hypoxia or hypercapnia: Secondary | ICD-10-CM

## 2013-11-04 DIAGNOSIS — I951 Orthostatic hypotension: Secondary | ICD-10-CM

## 2013-11-04 LAB — COMPREHENSIVE METABOLIC PANEL
ALT: 23 U/L (ref 0–53)
AST: 57 U/L — AB (ref 0–37)
Albumin: 1.9 g/dL — ABNORMAL LOW (ref 3.5–5.2)
Alkaline Phosphatase: 45 U/L (ref 39–117)
BILIRUBIN TOTAL: 2.8 mg/dL — AB (ref 0.3–1.2)
BUN: 38 mg/dL — AB (ref 6–23)
CHLORIDE: 93 meq/L — AB (ref 96–112)
CO2: 23 meq/L (ref 19–32)
CREATININE: 1.48 mg/dL — AB (ref 0.50–1.35)
Calcium: 7.5 mg/dL — ABNORMAL LOW (ref 8.4–10.5)
GFR calc Af Amer: 56 mL/min — ABNORMAL LOW (ref >90)
GFR, EST NON AFRICAN AMERICAN: 48 mL/min — AB (ref >90)
Glucose, Bld: 115 mg/dL — ABNORMAL HIGH (ref 70–99)
Potassium: 4.4 mEq/L (ref 3.7–5.3)
Sodium: 130 mEq/L — ABNORMAL LOW (ref 137–147)
Total Protein: 3.4 g/dL — ABNORMAL LOW (ref 6.0–8.3)

## 2013-11-04 LAB — GLUCOSE, CAPILLARY
GLUCOSE-CAPILLARY: 138 mg/dL — AB (ref 70–99)
Glucose-Capillary: 112 mg/dL — ABNORMAL HIGH (ref 70–99)
Glucose-Capillary: 118 mg/dL — ABNORMAL HIGH (ref 70–99)
Glucose-Capillary: 155 mg/dL — ABNORMAL HIGH (ref 70–99)
Glucose-Capillary: 184 mg/dL — ABNORMAL HIGH (ref 70–99)
Glucose-Capillary: 140 mg/dL — ABNORMAL HIGH (ref 70–99)

## 2013-11-04 LAB — CBC
HCT: 41.3 % (ref 39.0–52.0)
HEMATOCRIT: 39.6 % (ref 39.0–52.0)
Hemoglobin: 14.5 g/dL (ref 13.0–17.0)
Hemoglobin: 13.7 g/dL (ref 13.0–17.0)
MCH: 32.2 pg (ref 26.0–34.0)
MCH: 32 pg (ref 26.0–34.0)
MCHC: 35.1 g/dL (ref 30.0–36.0)
MCHC: 34.6 g/dL (ref 30.0–36.0)
MCV: 91.8 fL (ref 78.0–100.0)
MCV: 92.5 fL (ref 78.0–100.0)
PLATELETS: 74 10*3/uL — AB (ref 150–400)
Platelets: 64 10*3/uL — ABNORMAL LOW (ref 150–400)
RBC: 4.5 MIL/uL (ref 4.22–5.81)
RBC: 4.28 MIL/uL (ref 4.22–5.81)
RDW: 15.7 % — AB (ref 11.5–15.5)
RDW: 16 % — ABNORMAL HIGH (ref 11.5–15.5)
WBC: 1.3 10*3/uL — CL (ref 4.0–10.5)
WBC: 2.4 10*3/uL — ABNORMAL LOW (ref 4.0–10.5)

## 2013-11-04 LAB — URINE CULTURE
CULTURE: NO GROWTH
Colony Count: NO GROWTH

## 2013-11-04 LAB — CORTISOL: Cortisol, Plasma: 117 ug/dL

## 2013-11-04 LAB — PROCALCITONIN: Procalcitonin: 9.49 ng/mL

## 2013-11-04 LAB — LACTIC ACID, PLASMA: Lactic Acid, Venous: 3.4 mmol/L — ABNORMAL HIGH (ref 0.5–2.2)

## 2013-11-04 LAB — TROPONIN I: Troponin I: 1.42 ng/mL (ref <0.30)

## 2013-11-04 MED ORDER — PANTOPRAZOLE SODIUM 40 MG IV SOLR
40.0000 mg | Freq: Two times a day (BID) | INTRAVENOUS | Status: DC
  Administered 2013-11-04 – 2013-11-07 (×8): 40 mg via INTRAVENOUS
  Filled 2013-11-04 (×9): qty 40

## 2013-11-04 MED ORDER — ALBUMIN HUMAN 5 % IV SOLN
12.5000 g | Freq: Four times a day (QID) | INTRAVENOUS | Status: AC
  Administered 2013-11-04 – 2013-11-05 (×4): 12.5 g via INTRAVENOUS
  Filled 2013-11-04 (×4): qty 250

## 2013-11-04 MED ORDER — DEXTROSE 10 % IV SOLN
INTRAVENOUS | Status: DC
  Administered 2013-11-04: 09:00:00 via INTRAVENOUS

## 2013-11-04 NOTE — Progress Notes (Signed)
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 Hypotensive, requiring CVL placement and Levophed. Noted braun emesis, no overt hematemesis.  SIGNIFICANT EVENTS / STUDIES:  5/30  Abdomen / pelvis CT >>> Ascites, small gallstones, left pleural effusion and airspace disease 5/30  GI evaluation >>> significant GI hemorrhage unlikely, no indications for EGD 5/31  TTE >>>  LINES / TUBES: R IJ CVL 5/30 >>> Foley 5/30 >>>  CULTURES: 5/30 Blood >>> 5/30 Urine >>>  ANTIBIOTICS: Zosyn 5/30 >>> Vancomycin 5/30 >>>  INTERVAL HISTORY:  No more emesis overnight.  Feels better.  VITAL SIGNS: Temp:  [94.1 F (34.5 C)-99.7 F (37.6 C)] 99 F (37.2 C) (05/31 0800) Pulse Rate:  [44-153] 103 (05/31 0800) Resp:  [18-42] 18 (05/31 0800) BP: (65-109)/(18-71) 94/60 mmHg (05/31 0800) SpO2:  [69 %-100 %] 97 % (05/31 0800) Weight:  [74.7 kg (164 lb 10.9 oz)] 74.7 kg (164 lb 10.9 oz) (05/31 0400)  HEMODYNAMICS: CVP:  [5 mmHg-6 mmHg] 5 mmHg  VENTILATOR SETTINGS:   INTAKE / OUTPUT: Intake/Output     05/30 0701 - 05/31 0700 05/31 0701 - 06/01 0700   I.V. (mL/kg) 3403 (45.6) 337.6 (4.5)   IV Piggyback 162.5    Total Intake(mL/kg) 3565.5 (47.7) 337.6 (4.5)   Urine (mL/kg/hr) 945 60 (0.5)   Total Output 945 60   Net +2620.5 +277.6         PHYSICAL EXAMINATION: General:  Appears ill, no distress Neuro:  Awake, alert HEENT:  Temporal wasting, dry membranes Cardiovascular:  Regular, no murmurs Lungs:  Bilateral air entry, no added  sounds Abdomen:  Soft, distended, mild generalized tenderness Musculoskeletal:  Moves all extremities, no edema Skin:  Multiple ecchymoses   LABS: CBC  Recent Labs Lab 11/03/13 1545 11/03/13 2210 11/04/13 0350  WBC 1.3* 1.3* 2.4*  HGB 14.5 13.7 13.7  HCT 41.3 39.0 39.6  PLT 74* 77* 64*   Coag's  Recent Labs Lab 11/03/13 0916  INR 1.80*   BMET  Recent Labs Lab 11/03/13 0916 11/04/13 0350  NA 130* 130*  K 4.5 4.4  CL 87* 93*  CO2 25 23  BUN 40* 38*  CREATININE 1.47* 1.48*  GLUCOSE 67* 115*   Electrolytes  Recent Labs Lab 11/03/13 0916 11/03/13 1200 11/04/13 0350  CALCIUM 7.6*  --  7.5*  MG  --  1.8  --   PHOS  --  4.7*  --    Sepsis Markers  Recent Labs Lab 11/03/13 1155  11/03/13 1213 11/03/13 1600 11/04/13 0015 11/04/13 0350  LATICACIDVEN  --   < > 5.23* 4.3* 3.4*  --   PROCALCITON 7.05  --   --   --   --  9.49  < > = values in this interval not displayed.  ABG No results found for this basename: PHART, PCO2ART, PO2ART,  in the last 168 hours Liver Enzymes  Recent Labs Lab 11/03/13 0916 11/04/13 0350  AST 50* 57*  ALT 20 23  ALKPHOS 53 45  BILITOT 3.1* 2.8*  ALBUMIN 1.6* 1.9*  Cardiac Enzymes  Recent Labs Lab 11/03/13 1200 11/03/13 1600 11/04/13 0015  TROPONINI 1.10* 1.29* 1.42*   Glucose  Recent Labs Lab 11/03/13 1316 11/03/13 1712 11/03/13 2001 11/04/13 0037 11/04/13 0412 11/04/13 0748  GLUCAP 112* 100* 100* 112* 118* 138*   IMAGING:  Ct Abdomen Pelvis Wo Contrast  11/03/2013   CLINICAL DATA:  Nausea and vomiting, hypotension, cirrhosis  EXAM: CT ABDOMEN AND PELVIS WITHOUT CONTRAST  TECHNIQUE: Multidetector CT imaging of the abdomen and pelvis was performed following the standard protocol without IV contrast.  COMPARISON:  None.  FINDINGS: Moderate-sized left basilar effusion is noted. Associated infiltrate is noted in the left lower lobe.  There are cirrhotic changes of the liver with nodularity and a small size.  The spleen is mildly enlarged. Diffuse ascites is noted throughout the abdomen. Few small gallstones are seen. The adrenal glands and kidneys are within normal limits. The pancreas is within normal limits as well. The bladder is well distended. No pelvic mass lesion is noted. The appendix is not well visualized although no inflammatory changes are seen. The osseous structures are grossly unremarkable. The wall the distal esophagus is thickened which may be related to esophageal varices. The lack of IV contrast makes vascular evaluation difficult.  IMPRESSION: Changes consistent with cirrhosis of the liver and portal hypertension. Significant large volume ascites is noted.  Small gallstones without complicating factors.  Left pleural effusion and infiltrate   Electronically Signed   By: Inez Catalina M.D.   On: 11/03/2013 12:05   Dg Chest Portable 1 View  11/03/2013   CLINICAL DATA:  Status post right jugular line placement  EXAM: PORTABLE CHEST - 1 VIEW  COMPARISON:  11/03/2013  FINDINGS: Cardiac shadow is stable. Poor inspiratory effort is again noted with bibasilar atelectatic changes. A new right jugular line is noted with the catheter tip in the superior aspect of the right atrium. This is likely somewhat accentuated by the poor inspiratory effort. It could be withdrawn 1 cm as needed. No pneumothorax is noted.  IMPRESSION: Status post central line placement. This could be withdrawn 1 cm for more appropriate placement at the cavoatrial junction.  Bibasilar atelectatic changes.   Electronically Signed   By: Inez Catalina M.D.   On: 11/03/2013 10:50   Dg Chest Portable 1 View  11/03/2013   CLINICAL DATA:  Nausea/vomiting, weakness, cirrhosis  EXAM: PORTABLE CHEST - 1 VIEW  COMPARISON:  None.  FINDINGS: Low lung volumes. Mild left basilar opacity, likely atelectasis. No focal consolidation. No pleural effusion or pneumothorax.  Heart is normal size.  IMPRESSION: Low lung volumes with left basilar atelectasis.    Electronically Signed   By: Julian Hy M.D.   On: 11/03/2013 10:12   ASSESSMENT / PLAN:  PULMONARY A:   Lung hypoinflation from abdominal distension L effusion / hydrothorax with associated atelectasis, less likely pneumonia P:   Supplemental oxygen PRN  CARDIOVASCULAR A:  Shock, likely hypovolemic / septic; doubt significant hemorrhage; lactate trend reassuring Pericardial effusion? P:  Goal MAP>60 Levophed gtt, attempt to titrate to off Follow TTE results  RENAL A:   AKI Hypovolemia in setting of emesis / diuretics; CVP 4-6 P:   Goal CVP>10 Albumin 12.5 q6h x 4 doses Trend BMP Hold diuretics  GASTROINTESTINAL A:   Hep B related cirrhosis / ascites Nausea / vomiting ( without overt hematemesis ), improved Doubt clinically significant GI hemorrhage Intraabdominal source of sepsis? Protein calorie malnutrition P:   GI input appreciated Diet D/c Protonix  gtt Start Protonix bid D/c Octreotide gtt  HEMATOLOGIC A:   Thrombocytopenia >>> hypersplenism? sepsis? leukemia? bone marrow failure? Leucopenia >>> sepsis? leukemia? bone marrow failure? H/c CLL 2014 s/p chemo, in remission Coagulopathy, hepatic VTE Ps P:  Trend CBC / INR;  q6 h@h  discontinued SCDs  INFECTIOUS A:   Suspected intraabdominal sepsis SBP? P:   Cx / abx as above  ENDOCRINE  A:   Hypoglycemia in setting of liver disease  Adrenal insufficiency ruled out ( Cortisol 117 )  P:   CBG q4h Change IVF to D10@50 , may d/c when starts diet  NEUROLOGIC A:   No active isses P:   No intervention required  I have personally obtained history, examined patient, evaluated and interpreted laboratory and imaging results, reviewed medical records, formulated assessment / plan and placed orders.  CRITICAL CARE:  The patient is critically ill with multiple organ systems failure and requires high complexity decision making for assessment and support, frequent evaluation and titration of  therapies, application of advanced monitoring technologies and extensive interpretation of multiple databases. Critical Care Time devoted to patient care services described in this note is 35 minutes.   Doree Fudge, MD Pulmonary and [place] Pager: [phone]  11/04/2013, 8:35 AM

## 2013-11-04 NOTE — Progress Notes (Signed)
CRITICAL VALUE ALERT  Critical value received:  BC grew gram - rods  Date of notification:  11/04/13  Time of notification:  0320  Critical value read back:yes  Nurse who received alert:  M. Bill Mcvey  MD notified (1st page):  Dr. Jimmy Footman  Time of first page:  0323  MD notified (2nd page):  Time of second page:  Responding MD:  Dr. Jimmy Footman  Time MD responded:  [phone]

## 2013-11-04 NOTE — Progress Notes (Signed)
2 of 2 blood cultures growing gram-negative rods. Urine culture pending. No overt bleeding. Patient feels okay.  Impression:  1. Gram-negative sepsis of undetermined source   2. Chronic hepatitis B, with decompensation over the past 6 months, previously followed in [place]. Main clinical manifestation at this time is ascites and progressive weakness.  3. Heme positive stool at time of admission, without evidence of active GI bleeding  Recommendation:  1. Continue supportive care. 2. Consider diagnostic paracentesis to see if, retrospectively, there is evidence of bacterial peritonitis, which would have ramifications for prophylaxis going forward 3. Probable large-volume paracentesis, with concurrent albumin infusion, prior to discharge when the patient is hemodynamically more stable 4. Elective endoscopy and colonoscopy for evaluation of heme positivity. This might be accomplished prior to discharge, or it could be done as an outpatient  5. The patient will need to be connected with a local primary physician and a local gastroenterologist for management of his chronic hepatitis B, which is going to need increasingly intensive monitoring and treatment as time goes on 6. The patient is already scheduled for liver transplant evaluation at [facility] on June 19, by his previous gastroenterologist in [place], Dr. Evern Core, M.D. [phone]

## 2013-11-04 NOTE — Progress Notes (Signed)
  Echocardiogram 2D Echocardiogram has been performed.  Doyle Askew 11/04/2013, 8:41 AM

## 2013-11-05 DIAGNOSIS — R7881 Bacteremia: Secondary | ICD-10-CM

## 2013-11-05 DIAGNOSIS — A419 Sepsis, unspecified organism: Secondary | ICD-10-CM

## 2013-11-05 DIAGNOSIS — B9689 Other specified bacterial agents as the cause of diseases classified elsewhere: Secondary | ICD-10-CM

## 2013-11-05 DIAGNOSIS — R6521 Severe sepsis with septic shock: Secondary | ICD-10-CM

## 2013-11-05 LAB — ALBUMIN, FLUID (OTHER): ALBUMIN FL: 0.7 g/dL

## 2013-11-05 LAB — CBC
HCT: 31.6 % — ABNORMAL LOW (ref 39.0–52.0)
HEMOGLOBIN: 10.8 g/dL — AB (ref 13.0–17.0)
MCH: 31.9 pg (ref 26.0–34.0)
MCHC: 34.2 g/dL (ref 30.0–36.0)
MCV: 93.2 fL (ref 78.0–100.0)
Platelets: 37 10*3/uL — ABNORMAL LOW (ref 150–400)
RBC: 3.39 MIL/uL — ABNORMAL LOW (ref 4.22–5.81)
RDW: 16 % — ABNORMAL HIGH (ref 11.5–15.5)
WBC: 2.2 10*3/uL — ABNORMAL LOW (ref 4.0–10.5)

## 2013-11-05 LAB — BODY FLUID CELL COUNT WITH DIFFERENTIAL
LYMPHS FL: 10 %
MONOCYTE-MACROPHAGE-SEROUS FLUID: 58 % (ref 50–90)
Neutrophil Count, Fluid: 32 % — ABNORMAL HIGH (ref 0–25)
Total Nucleated Cell Count, Fluid: 136 cu mm (ref 0–1000)

## 2013-11-05 LAB — BASIC METABOLIC PANEL
BUN: 34 mg/dL — ABNORMAL HIGH (ref 6–23)
CALCIUM: 7.6 mg/dL — AB (ref 8.4–10.5)
CO2: 25 mEq/L (ref 19–32)
Chloride: 93 mEq/L — ABNORMAL LOW (ref 96–112)
Creatinine, Ser: 1.21 mg/dL (ref 0.50–1.35)
GFR calc Af Amer: 71 mL/min — ABNORMAL LOW (ref >90)
GFR calc non Af Amer: 62 mL/min — ABNORMAL LOW (ref >90)
GLUCOSE: 124 mg/dL — AB (ref 70–99)
POTASSIUM: 3.8 meq/L (ref 3.7–5.3)
Sodium: 129 mEq/L — ABNORMAL LOW (ref 137–147)

## 2013-11-05 LAB — GLUCOSE, CAPILLARY
Glucose-Capillary: 102 mg/dL — ABNORMAL HIGH (ref 70–99)
Glucose-Capillary: 105 mg/dL — ABNORMAL HIGH (ref 70–99)
Glucose-Capillary: 111 mg/dL — ABNORMAL HIGH (ref 70–99)
Glucose-Capillary: 118 mg/dL — ABNORMAL HIGH (ref 70–99)
Glucose-Capillary: 124 mg/dL — ABNORMAL HIGH (ref 70–99)
Glucose-Capillary: 87 mg/dL (ref 70–99)

## 2013-11-05 LAB — PHOSPHORUS: Phosphorus: 3.8 mg/dL (ref 2.3–4.6)

## 2013-11-05 LAB — MAGNESIUM: Magnesium: 1.9 mg/dL (ref 1.5–2.5)

## 2013-11-05 LAB — PROCALCITONIN: Procalcitonin: 4.94 ng/mL

## 2013-11-05 MED ORDER — ENSURE COMPLETE PO LIQD
237.0000 mL | Freq: Three times a day (TID) | ORAL | Status: DC
  Administered 2013-11-06 – 2013-11-07 (×2): 237 mL via ORAL

## 2013-11-05 MED ORDER — LIDOCAINE HCL 1 % IJ SOLN
2.0000 mL | Freq: Once | INTRAMUSCULAR | Status: AC
  Administered 2013-11-05: 2 mL via INTRADERMAL

## 2013-11-05 MED ORDER — DEXTROSE-NACL 5-0.9 % IV SOLN
INTRAVENOUS | Status: DC
  Administered 2013-11-05 – 2013-11-13 (×3): via INTRAVENOUS

## 2013-11-05 MED ORDER — LIDOCAINE HCL 1 % IJ SOLN
INTRAMUSCULAR | Status: AC
  Filled 2013-11-05: qty 20

## 2013-11-05 MED ORDER — BIOTENE DRY MOUTH MT LIQD: 15.0000 mL | Freq: Two times a day (BID) | OROMUCOSAL | Status: DC

## 2013-11-05 NOTE — Progress Notes (Signed)
I have reviewed and agree with assessment completed by Avel Peace DI  Atlee Abide [place] LDN Clinical Dietitian [phone]

## 2013-11-05 NOTE — Procedures (Signed)
Procedure Note:   Procedure: Diagnostic paracentesis Operator: R. Keywon Mestre Meds: 1% lidocaine Details: Skin cleaned w chlorhexadene and procedure performed with sterile technique. RLQ treated with 1% lidocaine at puncture site. 21g needle used to obtain ~10cc clear yellow/green fluid without difficulty. No complications.  Samples: 10cc ascitic fluid for cx, gram stain, cytology, albumin  Baltazar Apo, MD, PhD 11/05/2013, 10:50 AM [facility] [phone] or if no answer [phone]

## 2013-11-05 NOTE — Progress Notes (Signed)
[facility] Progress Note  Subjective: Feels better overall, more alert no particular pain, no evidence of bleeding  Objective: Vital signs in last 24 hours: Temp:  [97.7 F (36.5 C)-99 F (37.2 C)] 98.1 F (36.7 C) (06/01 1200) Pulse Rate:  [61-102] 89 (06/01 1200) Resp:  [14-38] 25 (06/01 1200) BP: (80-111)/(49-70) 102/61 mmHg (06/01 1200) SpO2:  [93 %-100 %] 96 % (06/01 1200) Weight:  [77.1 kg (169 lb 15.6 oz)] 77.1 kg (169 lb 15.6 oz) (06/01 0400) Weight change: 10.421 kg (22 lb 15.6 oz)   PE: Thin, gaunt, abdomen symmetrically distended. No palpable organomegaly  Lab Results: Results for orders placed during the hospital encounter of 11/03/13 (from the past 24 hour(s))  GLUCOSE, CAPILLARY     Status: Abnormal   Collection Time    11/04/13  3:55 PM      Result Value Ref Range   Glucose-Capillary 155 (*) 70 - 99 mg/dL   Comment 1 Documented in Chart     Comment 2 Notify RN    GLUCOSE, CAPILLARY     Status: Abnormal   Collection Time    11/04/13  7:55 PM      Result Value Ref Range   Glucose-Capillary 140 (*) 70 - 99 mg/dL  GLUCOSE, CAPILLARY     Status: Abnormal   Collection Time    11/05/13 12:56 AM      Result Value Ref Range   Glucose-Capillary 118 (*) 70 - 99 mg/dL  PROCALCITONIN     Status: None   Collection Time    11/05/13  4:00 AM      Result Value Ref Range   Procalcitonin 4.94    CBC     Status: Abnormal   Collection Time    11/05/13  4:00 AM      Result Value Ref Range   WBC 2.2 (*) 4.0 - 10.5 K/uL   RBC 3.39 (*) 4.22 - 5.81 MIL/uL   Hemoglobin 10.8 (*) 13.0 - 17.0 g/dL   HCT 31.6 (*) 39.0 - 52.0 %   MCV 93.2  78.0 - 100.0 fL   MCH 31.9  26.0 - 34.0 pg   MCHC 34.2  30.0 - 36.0 g/dL   RDW 16.0 (*) 11.5 - 15.5 %   Platelets 37 (*) 150 - 400 K/uL  BASIC METABOLIC PANEL     Status: Abnormal   Collection Time    11/05/13  4:00 AM      Result Value Ref Range   Sodium 129 (*) 137 - 147 mEq/L   Potassium 3.8  3.7 - 5.3 mEq/L   Chloride  93 (*) 96 - 112 mEq/L   CO2 25  19 - 32 mEq/L   Glucose, Bld 124 (*) 70 - 99 mg/dL   BUN 34 (*) 6 - 23 mg/dL   Creatinine, Ser 1.21  0.50 - 1.35 mg/dL   Calcium 7.6 (*) 8.4 - 10.5 mg/dL   GFR calc non Af Amer 62 (*) >90 mL/min   GFR calc Af Amer 71 (*) >90 mL/min  MAGNESIUM     Status: None   Collection Time    11/05/13  4:00 AM      Result Value Ref Range   Magnesium 1.9  1.5 - 2.5 mg/dL  PHOSPHORUS     Status: None   Collection Time    11/05/13  4:00 AM      Result Value Ref Range   Phosphorus 3.8  2.3 - 4.6 mg/dL  GLUCOSE, CAPILLARY  Status: Abnormal   Collection Time    11/05/13  5:00 AM      Result Value Ref Range   Glucose-Capillary 124 (*) 70 - 99 mg/dL  GLUCOSE, CAPILLARY     Status: Abnormal   Collection Time    11/05/13  7:31 AM      Result Value Ref Range   Glucose-Capillary 105 (*) 70 - 99 mg/dL   Comment 1 Notify RN     Comment 2 Documented in Chart    GLUCOSE, CAPILLARY     Status: Abnormal   Collection Time    11/05/13 11:37 AM      Result Value Ref Range   Glucose-Capillary 102 (*) 70 - 99 mg/dL   Comment 1 Notify RN     Comment 2 Documented in Chart      Studies/Results: No results found.    Assessment: Hepatitis B with cirrhosis' Increasingly refractory ascites secondary to hepatitis. Gram-negative bacteremia CLL  Plan: Await diagnostic paracentesis results and continue current IV antibiotics pending. Possible large volume paracentesis later, or resumption of spironolactone and furosemide if allowed by blood pressure and electrolyte abnormalities Consider elective workup of heme-positive stools at some point Transplant evaluation scheduled to June 19 at [facility].     Missy Sabins 11/05/2013, 12:30 PM

## 2013-11-05 NOTE — Progress Notes (Signed)
INITIAL NUTRITION ASSESSMENT  DOCUMENTATION CODES Per approved criteria  -Severe malnutrition in the context of chronic illness   Pt meets criteria for severe MALNUTRITION in the context of chronic illness as evidenced by severe depletion of muscle mass and severe fluid accumulation.   INTERVENTION: Ensure Complete po TID(vanilla), each supplement providing 350 kcals and 13 grams of protein  RD to continue to follow nutrition care plan  NUTRITION DIAGNOSIS: Inadequate oral intake related to poor appetite, ascites as evidenced by full liquid diet x 2 months.   Goal: Pt to meet >/=90% of estimated nutrition needs   Monitor:  PO intake, supplement acceptance, weight trend, labs   Reason for Assessment: Positive Malnutrition Screening Tool Score   63 y.o. 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 PMHx significant for cancer, Hep B, cirrhosis.   -Pt states that he has had recent unintentional weight loss, but attributes this to his fluid loss with diuretics at home   - Pt states that his usual body weight is 150- 160 lbs without fluid - Pt states that he has been on a mostly fluid diet for 2 months without any solid food. Prior to this, pt states that he ate a normal diet with 3 meals/ day  - Pt states that he drinks Ensure supplements at home - Unable to determine is pt has had severe weight loss due to fluid accumulation and lack of prior history in chart   Nutrition Focused Physical Exam:  Subcutaneous Fat:  Orbital Region: mild depletion  Upper Arm Region: mild depletion  Thoracic and Lumbar Region: wnl   Muscle:  Temple Region: severe depletion  Clavicle Bone Region: severe depletion  Clavicle and Acromion Bone Region: severe depletion  Scapular Bone Region:  severe depletion  Dorsal Hand: n/a  Patellar Region: mild depletion  Anterior Thigh Region: wnl  Posterior Calf Region: wnl   Edema: +1 LLE, +1 RLE , Gross Ascites abdomen    Height: Ht Readings from Last 1 Encounters:  11/03/13 6' (1.829 m)    Weight: Wt Readings from Last 1 Encounters:  11/05/13 169 lb 15.6 oz (77.1 kg)    Ideal Body Weight: 80.9 kg   % Ideal Body Weight: 95%   Wt Readings from Last 10 Encounters:  11/05/13 169 lb 15.6 oz (77.1 kg)    Usual Body Weight: 150 - 160 lbs  % Usual Body Weight: 106%   BMI:  Body mass index is 23.05 kg/(m^2).  Estimated Nutritional Needs: Kcal: 1900 - 2100 Protein: 115 - 130 grams  Fluid: >/= 1.9 L/day  Skin: WDL   Diet Order: General  EDUCATION NEEDS: -No education needs identified at this time   Intake/Output Summary (Last 24 hours) at 11/05/13 1130 Last data filed at 11/05/13 1100  Gross per 24 hour  Intake 2790.45 ml  Output   1075 ml  Net 1715.45 ml  +[phone] since admission  Last BM: PTA    Labs:   Recent Labs Lab 11/03/13 0916 11/03/13 1200 11/04/13 0350 11/05/13 0400  NA 130*  --  130* 129*  K 4.5  --  4.4 3.8  CL 87*  --  93* 93*  CO2 25  --  23 25  BUN 40*  --  38* 34*  CREATININE 1.47*  --  1.48*  1.21  CALCIUM 7.6*  --  7.5* 7.6*  MG  --  1.8  --  1.9  PHOS  --  4.7*  --  3.8  GLUCOSE 67*  --  115* 124*    CBG (last 3)   Recent Labs  11/05/13 0056 11/05/13 0500 11/05/13 0731  GLUCAP 118* 124* 105*    Scheduled Meds: . antiseptic oral rinse  15 mL Mouth Rinse BID  . pantoprazole (PROTONIX) IV  40 mg Intravenous Q12H  . piperacillin-tazobactam (ZOSYN)  IV  3.375 g Intravenous Q8H    Continuous Infusions: . dextrose 5 % and 0.9% NaCl 50 mL/hr at 11/05/13 1006  . norepinephrine (LEVOPHED) Adult infusion Stopped (11/04/13 1845)    Past Medical History  Diagnosis Date  . Cancer   . Hepatitis B   . Cirrhosis     No past surgical history on file.  Carrolyn Leigh, BS Dietetic Intern Pager: [phone]

## 2013-11-05 NOTE — Progress Notes (Signed)
PULMONARY / CRITICAL CARE MEDICINE  Name: Tahjai Schetter MRN: [id number] DOB: 06/30/1950    ADMISSION DATE:  11/03/2013 CONSULTATION DATE:  11/03/2013  REFERRING MD :  EDP PRIMARY SERVICE:  PCCM  CHIEF COMPLAINT:  Hypotension  BRIEF PATIENT DESCRIPTION: 63 yo with Hep B cirrhosis ( no alcohol use ) on multiple diuretics preadmission, treated for CLL ( outside facility ) in 2014 ( now in remission ) presenting to [facility] ED on 5/30 with weakness, nausea, vomiting and hypotension since the night before admission. Hypotensive, requiring CVL placement and Levophed. Noted braun emesis, no overt hematemesis.  SIGNIFICANT EVENTS / STUDIES:  5/30  Abdomen / pelvis CT >>> Ascites, small gallstones, left pleural effusion and airspace disease 5/30  GI evaluation >>> significant GI hemorrhage unlikely, no indications for EGD 5/31  TTE >>>nl lv func. Ef 60%, small pericardial effusion. 6/1 diagnostic para>>  LINES / TUBES: R IJ CVL 5/30 >>> Foley 5/30 >>>  CULTURES: 5/30 Blood >>> GNR >>  5/30 Urine >>>neg 6/1 body fluid>>  ANTIBIOTICS: Zosyn 5/30 >>> Vancomycin 5/30 >>> 6/1  INTERVAL HISTORY: NAD, breathing ok.  VITAL SIGNS: Temp:  [97.7 F (36.5 C)-99 F (37.2 C)] 97.7 F (36.5 C) (06/01 0900) Pulse Rate:  [61-102] 91 (06/01 0900) Resp:  [14-38] 21 (06/01 0900) BP: (80-111)/(49-70) 111/57 mmHg (06/01 0900) SpO2:  [93 %-100 %] 95 % (06/01 0900) Weight:  [77.1 kg (169 lb 15.6 oz)] 77.1 kg (169 lb 15.6 oz) (06/01 0400)  HEMODYNAMICS: CVP:  [9 mmHg-16 mmHg] 16 mmHg  VENTILATOR SETTINGS:   INTAKE / OUTPUT: Intake/Output     05/31 0701 - 06/01 0700 06/01 0701 - 06/02 0700   P.O.  360   I.V. (mL/kg) 1640.3 (21.3) 100 (1.3)   Other  20   IV Piggyback 1300 50   Total Intake(mL/kg) 2940.3 (38.1) 530 (6.9)   Urine (mL/kg/hr) 1135 (0.6) 75 (0.5)   Total Output 1135 75   Net +1805.3 +455         PHYSICAL EXAMINATION: General:  Appears ill, no distress Neuro:  Awake, alert, mae x  4 HEENT:  Temporal wasting, dry membranes Cardiovascular:  Regular, no murmurs Lungs:  Bilateral air entry, no added sounds. Diminished in bases. Abdomen:  Soft, distended, mild generalized tenderness,++ ascites to percussion, not tense Musculoskeletal:  Moves all extremities, + le  edema Skin:  Multiple ecchymoses   LABS: CBC  Recent Labs Lab 11/03/13 2210 11/04/13 0350 11/05/13 0400  WBC 1.3* 2.4* 2.2*  HGB 13.7 13.7 10.8*  HCT 39.0 39.6 31.6*  PLT 77* 64* 37*   Coag's  Recent Labs Lab 11/03/13 0916  INR 1.80*   BMET  Recent Labs Lab 11/03/13 0916 11/04/13 0350 11/05/13 0400  NA 130* 130* 129*  K 4.5 4.4 3.8  CL 87* 93* 93*  CO2 25 23 25   BUN 40* 38* 34*  CREATININE 1.47* 1.48* 1.21  GLUCOSE 67* 115* 124*   Electrolytes  Recent Labs Lab 11/03/13 0916 11/03/13 1200 11/04/13 0350 11/05/13 0400  CALCIUM 7.6*  --  7.5* 7.6*  MG  --  1.8  --  1.9  PHOS  --  4.7*  --  3.8   Sepsis Markers  Recent Labs Lab 11/03/13 1155  11/03/13 1213 11/03/13 1600 11/04/13 0015 11/04/13 0350 11/05/13 0400  LATICACIDVEN  --   < > 5.23* 4.3* 3.4*  --   --   PROCALCITON 7.05  --   --   --   --  9.49 4.94  < > =  values in this interval not displayed.  ABG No results found for this basename: PHART, PCO2ART, PO2ART,  in the last 168 hours Liver Enzymes  Recent Labs Lab 11/03/13 0916 11/04/13 0350  AST 50* 57*  ALT 20 23  ALKPHOS 53 45  BILITOT 3.1* 2.8*  ALBUMIN 1.6* 1.9*   Cardiac Enzymes  Recent Labs Lab 11/03/13 1200 11/03/13 1600 11/04/13 0015  TROPONINI 1.10* 1.29* 1.42*   Glucose  Recent Labs Lab 11/04/13 1214 11/04/13 1555 11/04/13 1955 11/05/13 0056 11/05/13 0500 11/05/13 0731  GLUCAP 184* 155* 140* 118* 124* 105*   IMAGING:  Ct Abdomen Pelvis Wo Contrast  11/03/2013   CLINICAL DATA:  Nausea and vomiting, hypotension, cirrhosis  EXAM: CT ABDOMEN AND PELVIS WITHOUT CONTRAST  TECHNIQUE: Multidetector CT imaging of the abdomen and  pelvis was performed following the standard protocol without IV contrast.  COMPARISON:  None.  FINDINGS: Moderate-sized left basilar effusion is noted. Associated infiltrate is noted in the left lower lobe.  There are cirrhotic changes of the liver with nodularity and a small size. The spleen is mildly enlarged. Diffuse ascites is noted throughout the abdomen. Few small gallstones are seen. The adrenal glands and kidneys are within normal limits. The pancreas is within normal limits as well. The bladder is well distended. No pelvic mass lesion is noted. The appendix is not well visualized although no inflammatory changes are seen. The osseous structures are grossly unremarkable. The wall the distal esophagus is thickened which may be related to esophageal varices. The lack of IV contrast makes vascular evaluation difficult.  IMPRESSION: Changes consistent with cirrhosis of the liver and portal hypertension. Significant large volume ascites is noted.  Small gallstones without complicating factors.  Left pleural effusion and infiltrate   Electronically Signed   By: Inez Catalina M.D.   On: 11/03/2013 12:05   Dg Chest Portable 1 View  11/03/2013   CLINICAL DATA:  Status post right jugular line placement  EXAM: PORTABLE CHEST - 1 VIEW  COMPARISON:  11/03/2013  FINDINGS: Cardiac shadow is stable. Poor inspiratory effort is again noted with bibasilar atelectatic changes. A new right jugular line is noted with the catheter tip in the superior aspect of the right atrium. This is likely somewhat accentuated by the poor inspiratory effort. It could be withdrawn 1 cm as needed. No pneumothorax is noted.  IMPRESSION: Status post central line placement. This could be withdrawn 1 cm for more appropriate placement at the cavoatrial junction.  Bibasilar atelectatic changes.   Electronically Signed   By: Inez Catalina M.D.   On: 11/03/2013 10:50   Dg Chest Portable 1 View  11/03/2013   CLINICAL DATA:  Nausea/vomiting, weakness,  cirrhosis  EXAM: PORTABLE CHEST - 1 VIEW  COMPARISON:  None.  FINDINGS: Low lung volumes. Mild left basilar opacity, likely atelectasis. No focal consolidation. No pleural effusion or pneumothorax.  Heart is normal size.  IMPRESSION: Low lung volumes with left basilar atelectasis.   Electronically Signed   By: Julian Hy M.D.   On: 11/03/2013 10:12   ASSESSMENT / PLAN:  PULMONARY A:   Lung hypoinflation from abdominal distension L effusion / hydrothorax with associated atelectasis, less likely pneumonia P:   Supplemental oxygen PRN Pulm Hygiene   CARDIOVASCULAR A:  Septic Shock; doubt significant contribution hemorrhage Pericardial effusion small and non obstructive based on TTE.  P:  Goal MAP>60 Levophed gtt, weaned off Albumin if large volume paracentesis performed > suspect he is too tenuous at present to tolerate  RENAL  A:   AKI due to ATN, septic shock, hypovolemia P:   Goal CVP>10 Trend BMP Hold diuretics > likely restart 6/2  GASTROINTESTINAL A:   Hep B related cirrhosis / ascites Nausea / vomiting ( without overt hematemesis ), improved Doubt clinically significant GI hemorrhage Consider SBP as underlying cause sepsis Protein calorie malnutrition P:   GI input appreciated Diet Protonix bid Diagnostic para performed 6/1; results will affect type of SBP prophylaxis he will need in the future.   HEMATOLOGIC A:   Thrombocytopenia >>> hypersplenism? sepsis? leukemia? bone marrow failure? Leucopenia >>> sepsis? leukemia? bone marrow failure? H/c CLL 2014 s/p chemo, in remission Coagulopathy, hepatic VTE Ps P:  Trend CBC / INR;  SCDs  INFECTIOUS A:   GNR bacteremia, not yet speciated. Suspect due to SBP  P:   Continue zosyn, d/c vanco on 6/1 Paracentesis as diagnostic not therapeutic on 6/1  ENDOCRINE  A:   Hypoglycemia in setting of liver disease  Adrenal insufficiency ruled out ( Cortisol 117 )  P:   CBG q4h Change IVF to  D5ns  NEUROLOGIC A:   No active isses P:   No intervention required  Richardson Landry Minor ACNP Maryanna Shape PCCM Pager [phone] till 3 pm If no answer page [phone] 11/05/2013, 9:34 AM  35 minutes CC time  Baltazar Apo, MD, PhD 11/05/2013, 10:45 AM [facility] [phone] or if no answer [phone]

## 2013-11-06 ENCOUNTER — Inpatient Hospital Stay (HOSPITAL_COMMUNITY): Payer: Medicaid Other

## 2013-11-06 DIAGNOSIS — E43 Unspecified severe protein-calorie malnutrition: Secondary | ICD-10-CM | POA: Insufficient documentation

## 2013-11-06 LAB — CULTURE, BLOOD (ROUTINE X 2)

## 2013-11-06 LAB — GLUCOSE, CAPILLARY
GLUCOSE-CAPILLARY: 117 mg/dL — AB (ref 70–99)
Glucose-Capillary: 102 mg/dL — ABNORMAL HIGH (ref 70–99)
Glucose-Capillary: 104 mg/dL — ABNORMAL HIGH (ref 70–99)
Glucose-Capillary: 88 mg/dL (ref 70–99)
Glucose-Capillary: 95 mg/dL (ref 70–99)
Glucose-Capillary: 96 mg/dL (ref 70–99)

## 2013-11-06 LAB — BASIC METABOLIC PANEL
BUN: 30 mg/dL — ABNORMAL HIGH (ref 6–23)
CO2: 25 mEq/L (ref 19–32)
Calcium: 7.4 mg/dL — ABNORMAL LOW (ref 8.4–10.5)
Chloride: 95 mEq/L — ABNORMAL LOW (ref 96–112)
Creatinine, Ser: 1 mg/dL (ref 0.50–1.35)
GFR calc Af Amer: 90 mL/min — ABNORMAL LOW (ref >90)
GFR, EST NON AFRICAN AMERICAN: 78 mL/min — AB (ref >90)
Glucose, Bld: 113 mg/dL — ABNORMAL HIGH (ref 70–99)
POTASSIUM: 3.4 meq/L — AB (ref 3.7–5.3)
SODIUM: 130 meq/L — AB (ref 137–147)

## 2013-11-06 LAB — CBC
HCT: 34.8 % — ABNORMAL LOW (ref 39.0–52.0)
Hemoglobin: 12 g/dL — ABNORMAL LOW (ref 13.0–17.0)
MCH: 31.7 pg (ref 26.0–34.0)
MCHC: 34.5 g/dL (ref 30.0–36.0)
MCV: 92.1 fL (ref 78.0–100.0)
Platelets: 29 10*3/uL — CL (ref 150–400)
RBC: 3.78 MIL/uL — ABNORMAL LOW (ref 4.22–5.81)
RDW: 15.8 % — AB (ref 11.5–15.5)
WBC: 3 10*3/uL — ABNORMAL LOW (ref 4.0–10.5)

## 2013-11-06 LAB — MAGNESIUM: MAGNESIUM: 1.8 mg/dL (ref 1.5–2.5)

## 2013-11-06 LAB — PROTIME-INR
INR: 1.83 — ABNORMAL HIGH (ref 0.00–1.49)
PROTHROMBIN TIME: 20.6 s — AB (ref 11.6–15.2)

## 2013-11-06 LAB — PHOSPHORUS: Phosphorus: 2.7 mg/dL (ref 2.3–4.6)

## 2013-11-06 MED ORDER — SPIRONOLACTONE 25 MG PO TABS
50.0000 mg | ORAL_TABLET | Freq: Every day | ORAL | Status: DC
  Administered 2013-11-06 – 2013-11-13 (×7): 50 mg via ORAL
  Filled 2013-11-06 (×8): qty 1

## 2013-11-06 MED ORDER — POTASSIUM CHLORIDE CRYS ER 20 MEQ PO TBCR
20.0000 meq | EXTENDED_RELEASE_TABLET | ORAL | Status: AC
  Administered 2013-11-06 (×2): 20 meq via ORAL
  Filled 2013-11-06 (×2): qty 1

## 2013-11-06 MED ORDER — CEFTRIAXONE SODIUM 1 G IJ SOLR
1.0000 g | INTRAMUSCULAR | Status: DC
  Administered 2013-11-06 – 2013-11-11 (×6): 1 g via INTRAVENOUS
  Filled 2013-11-06 (×7): qty 10

## 2013-11-06 NOTE — Progress Notes (Signed)
[facility] Progress Note  Subjective: Feels okay, no particular complaints  Objective: Vital signs in last 24 hours: Temp:  [97.7 F (36.5 C)-98.8 F (37.1 C)] 98.2 F (36.8 C) (06/02 0900) Pulse Rate:  [89-99] 97 (06/02 0900) Resp:  [13-37] 30 (06/02 0900) BP: (97-114)/(61-77) 108/69 mmHg (06/02 0900) SpO2:  [92 %-97 %] 94 % (06/02 0900) Weight:  [79.2 kg (174 lb 9.7 oz)] 79.2 kg (174 lb 9.7 oz) (06/02 0400) Weight change: 2.1 kg (4 lb 10.1 oz)   PE: Alert and oriented no asterixis. Abdomen symmetrically distended  Lab Results: Results for orders placed during the hospital encounter of 11/03/13 (from the past 24 hour(s))  BODY FLUID CULTURE     Status: None   Collection Time    11/05/13 10:40 AM      Result Value Ref Range   Specimen Description PERITONEAL CAVITY     Special Requests Immunocompromised     Gram Stain       Value: FEW WBC PRESENT,BOTH PMN AND MONONUCLEAR     NO ORGANISMS SEEN     Performed at [facility]   Culture PENDING     Report Status PENDING    ALBUMIN, FLUID     Status: None   Collection Time    11/05/13 10:40 AM      Result Value Ref Range   Albumin, Fluid 0.7     Fluid Type-FALB PERITONEAL CAVITY    GLUCOSE, CAPILLARY     Status: Abnormal   Collection Time    11/05/13 11:37 AM      Result Value Ref Range   Glucose-Capillary 102 (*) 70 - 99 mg/dL   Comment 1 Notify RN     Comment 2 Documented in Chart    BODY FLUID CELL COUNT WITH DIFFERENTIAL     Status: Abnormal   Collection Time    11/05/13 12:16 PM      Result Value Ref Range   Fluid Type-FCT PERITONEAL     Color, Fluid YELLOW  YELLOW   Appearance, Fluid CLEAR  CLEAR   WBC, Fluid 136  0 - 1000 cu mm   Neutrophil Count, Fluid 32 (*) 0 - 25 %   Lymphs, Fluid 10     Monocyte-Macrophage-Serous Fluid 58  50 - 90 %  GLUCOSE, CAPILLARY     Status: Abnormal   Collection Time    11/05/13  3:20 PM      Result Value Ref Range   Glucose-Capillary 111 (*) 70 - 99 mg/dL    Comment 1 Documented in Chart     Comment 2 Notify RN    GLUCOSE, CAPILLARY     Status: None   Collection Time    11/05/13  7:05 PM      Result Value Ref Range   Glucose-Capillary 87  70 - 99 mg/dL  GLUCOSE, CAPILLARY     Status: Abnormal   Collection Time    11/05/13 11:45 PM      Result Value Ref Range   Glucose-Capillary 104 (*) 70 - 99 mg/dL   Comment 1 Documented in Chart     Comment 2 Notify RN    CBC     Status: Abnormal   Collection Time    11/06/13  4:45 AM      Result Value Ref Range   WBC 3.0 (*) 4.0 - 10.5 K/uL   RBC 3.78 (*) 4.22 - 5.81 MIL/uL   Hemoglobin 12.0 (*) 13.0 - 17.0 g/dL   HCT  34.8 (*) 39.0 - 52.0 %   MCV 92.1  78.0 - 100.0 fL   MCH 31.7  26.0 - 34.0 pg   MCHC 34.5  30.0 - 36.0 g/dL   RDW 15.8 (*) 11.5 - 15.5 %   Platelets 29 (*) 150 - 400 K/uL  BASIC METABOLIC PANEL     Status: Abnormal   Collection Time    11/06/13  4:45 AM      Result Value Ref Range   Sodium 130 (*) 137 - 147 mEq/L   Potassium 3.4 (*) 3.7 - 5.3 mEq/L   Chloride 95 (*) 96 - 112 mEq/L   CO2 25  19 - 32 mEq/L   Glucose, Bld 113 (*) 70 - 99 mg/dL   BUN 30 (*) 6 - 23 mg/dL   Creatinine, Ser 1.00  0.50 - 1.35 mg/dL   Calcium 7.4 (*) 8.4 - 10.5 mg/dL   GFR calc non Af Amer 78 (*) >90 mL/min   GFR calc Af Amer 90 (*) >90 mL/min  PHOSPHORUS     Status: None   Collection Time    11/06/13  4:45 AM      Result Value Ref Range   Phosphorus 2.7  2.3 - 4.6 mg/dL  MAGNESIUM     Status: None   Collection Time    11/06/13  4:45 AM      Result Value Ref Range   Magnesium 1.8  1.5 - 2.5 mg/dL  PROTIME-INR     Status: Abnormal   Collection Time    11/06/13  4:45 AM      Result Value Ref Range   Prothrombin Time 20.6 (*) 11.6 - 15.2 seconds   INR 1.83 (*) 0.00 - 1.49  GLUCOSE, CAPILLARY     Status: Abnormal   Collection Time    11/06/13  4:54 AM      Result Value Ref Range   Glucose-Capillary 102 (*) 70 - 99 mg/dL   Comment 1 Documented in Chart     Comment 2 Notify RN       Studies/Results: Dg Chest Port 1 View  11/06/2013   CLINICAL DATA:  Respiratory distress.  EXAM: PORTABLE CHEST - 1 VIEW  COMPARISON:  11/03/2013  FINDINGS: Low lung volumes in this patient with extensive ascites. Normal heart size. No change in upper mediastinal contours. Layering left pleural effusion with increased hazy density of the left chest. Remote bilateral rib fractures. No pulmonary edema. No pneumothorax. Stable positioning of right IJ catheter, tip at the upper right atrium.  IMPRESSION: Interval increase or layering of a moderate left pleural effusion.   Electronically Signed   By: Jorje Guild M.D.   On: 11/06/2013 06:13      Assessment: Hepatitis B with cirrhosis'  Increasingly refractory ascites secondary to hepatitis.  Gram-negative bacteremia ,  paracentesis does not suggest SBP as source of current bacteremia  CLL No evidence of GI bleeding    Plan: Await urine and body fluid culture.  Continue antibiotics Consider resuming diuretics when persistently normotensive versus  Large-volume paracentesis prn   Missy Sabins 11/06/2013, 9:54 AM

## 2013-11-06 NOTE — Evaluation (Signed)
Physical Therapy Evaluation Patient Details Name: Tyler Huang MRN: [id number] DOB: 12-16-1950 Today's Date: 11/06/2013   History of Present Illness  63 yo male admitted with hypotension, sepsis, L effusion/hydrothorax, ascites. hx of leukemia, cirrhosis, Hep B  Clinical Impression  On eval, pt required Mod assist +2 to stand and Min assist to ambulate ~75 feet with walker. Demonstrates general weakness, decreased activity tolerance and impaired gait and balance. Discussed d/c plan-pt is open to ST rehab at SNF, if needed. Will follow during stay.     Follow Up Recommendations SNF;Supervision/Assistance - 24 hour (depending on progress)    Equipment Recommendations  None recommended by PT    Recommendations for Other Services OT consult     Precautions / Restrictions Precautions Precautions: Fall Restrictions Weight Bearing Restrictions: No      Mobility  Bed Mobility               General bed mobility comments: pt sitting in recliner  Transfers Overall transfer level: Needs assistance Equipment used: Rolling walker (2 wheeled) Transfers: Sit to/from Stand Sit to Stand: Mod assist;+2 physical assistance;+2 safety/equipment         General transfer comment: Assist to rise, stabilize, control descent. VCs safety, technique, hand placement  Ambulation/Gait Ambulation/Gait assistance: Min assist Ambulation Distance (Feet): 75 Feet Assistive device: Rolling walker (2 wheeled) Gait Pattern/deviations: Step-through pattern;Decreased stride length     General Gait Details: slow gait speed. assist to stabilize throughout ambulation. fatigue fairly easily. dyspnea 2/4.   Stairs            Wheelchair Mobility    Modified Rankin (Stroke Patients Only)       Balance                                             Pertinent Vitals/Pain Pt denied pain    Home Living Family/patient expects to be discharged to:: Private residence Living  Arrangements: Other relatives   Type of Home: House Home Access: Stairs to enter   Technical brewer of Steps: 2 Home Layout: Two level;Bed/bath upstairs Home Equipment: Environmental consultant - 2 wheels      Prior Function Level of Independence: Independent with assistive device(s)               Hand Dominance        Extremity/Trunk Assessment   Upper Extremity Assessment: Generalized weakness           Lower Extremity Assessment: Generalized weakness      Cervical / Trunk Assessment: Kyphotic  Communication   Communication: No difficulties  Cognition Arousal/Alertness: Awake/alert Behavior During Therapy: WFL for tasks assessed/performed Overall Cognitive Status: Within Functional Limits for tasks assessed                      General Comments      Exercises        Assessment/Plan    PT Assessment Patient needs continued PT services  PT Diagnosis Difficulty walking;Generalized weakness   PT Problem List Decreased strength;Decreased activity tolerance;Decreased balance;Decreased mobility;Pain;Decreased knowledge of use of DME  PT Treatment Interventions DME instruction;Gait training;Functional mobility training;Therapeutic activities;Therapeutic exercise;Patient/family education;Balance training   PT Goals (Current goals can be found in the Care Plan section) Acute Rehab PT Goals Patient Stated Goal: to regain independence PT Goal Formulation: With patient Time For Goal Achievement: 11/20/13 Potential to Achieve  Goals: Good    Frequency Min 3X/week   Barriers to discharge        Co-evaluation               End of Session   Activity Tolerance: Patient limited by fatigue Patient left: in chair;with call bell/phone within reach           Time: 1417-1431 PT Time Calculation (min): 14 min   Charges:   PT Evaluation $Initial PT Evaluation Tier I: 1 Procedure PT Treatments $Gait Training: 8-22 mins   PT G Codes:           Weston Anna, MPT Pager: [phone]

## 2013-11-06 NOTE — Progress Notes (Signed)
[facility] ADULT ICU REPLACEMENT PROTOCOL FOR AM LAB REPLACEMENT ONLY  The patient does apply for the [facility] Adult ICU Electrolyte Replacment Protocol based on the criteria listed below:   1. Is GFR >/= 40 ml/min? yes  Patient's GFR today is 78 2. Is urine output >/= 0.5 ml/kg/hr for the last 6 hours? yes Patient's UOP is 0.52 ml/kg/hr 3. Is BUN < 60 mg/dL? yes  Patient's BUN today is 30 4. Abnormal electrolyte(s):  K 3.4, Mg 1.8 5. Ordered repletion with: per protocol 6. If a panic level lab has been reported, has the CCM MD in charge been notified? no.   Physician:    Billy Fischer 11/06/2013 5:55 AM

## 2013-11-06 NOTE — Progress Notes (Addendum)
PULMONARY / CRITICAL CARE MEDICINE  Name: Tyler Huang MRN: [id number] DOB: February 15, 1951    ADMISSION DATE:  11/03/2013 CONSULTATION DATE:  11/03/2013  REFERRING MD :  EDP PRIMARY SERVICE:  PCCM  CHIEF COMPLAINT:  Hypotension  BRIEF PATIENT DESCRIPTION: 63 yo with Hep B cirrhosis ( no alcohol use ) on multiple diuretics preadmission, treated for CLL ( outside facility ) in 2014 ( now in remission ) presenting to [facility] ED on 5/30 with weakness, nausea, vomiting and hypotension since the night before admission. Hypotensive, requiring CVL placement and Levophed. Noted braun emesis, no overt hematemesis.  SIGNIFICANT EVENTS / STUDIES:  5/30  Abdomen / pelvis CT >>> Ascites, small gallstones, left pleural effusion and airspace disease 5/30  GI evaluation >>> significant GI hemorrhage unlikely, no indications for EGD 5/31  TTE >>>nl lv func. Ef 60%, small pericardial effusion. 6/1 diagnostic para>>  LINES / TUBES: R IJ CVL 5/30 >>> Foley 5/30 >>>  CULTURES: 5/30 Blood >>> GNR >> Klebsiella 5/30 Urine >>>neg 6/1 body fluid>>  ANTIBIOTICS: Zosyn 5/30 >>> 6/2 Vancomycin 5/30 >>> 6/1 Ceftriaxone 6/2 >>   INTERVAL HISTORY: NAD, breathing ok. Off O2  VITAL SIGNS: Temp:  [97.7 F (36.5 C)-98.8 F (37.1 C)] 98.2 F (36.8 C) (06/02 0600) Pulse Rate:  [89-99] 95 (06/02 0600) Resp:  [13-37] 21 (06/02 0600) BP: (97-114)/(57-75) 107/75 mmHg (06/02 0600) SpO2:  [92 %-97 %] 95 % (06/02 0600) Weight:  [79.2 kg (174 lb 9.7 oz)] 79.2 kg (174 lb 9.7 oz) (06/02 0400)  HEMODYNAMICS: CVP:  [8 mmHg-10 mmHg] 8 mmHg  VENTILATOR SETTINGS:   INTAKE / OUTPUT: Intake/Output     06/01 0701 - 06/02 0700 06/02 0701 - 06/03 0700   P.O. 840    I.V. (mL/kg) 1200 (15.2)    Other 20    IV Piggyback 187.5    Total Intake(mL/kg) 2247.5 (28.4)    Urine (mL/kg/hr) 1000 (0.5)    Total Output 1000     Net +1247.5          Stool Occurrence 1 x     PHYSICAL EXAMINATION: General:  Appears ill, no distress,  off o2 Neuro:  Awake, alert, mae x 4 HEENT:  Temporal wasting, dry membranes Cardiovascular:  Regular, no murmurs Lungs:  Bilateral air entry, no added sounds. Diminished in bases. Abdomen:  Soft, distended, mild generalized tenderness,++ ascites to percussion, not tense. bandaid add para site  Musculoskeletal:  Moves all extremities, + le  edema Skin:  Multiple ecchymoses   LABS: CBC  Recent Labs Lab 11/04/13 0350 11/05/13 0400 11/06/13 0445  WBC 2.4* 2.2* 3.0*  HGB 13.7 10.8* 12.0*  HCT 39.6 31.6* 34.8*  PLT 64* 37* 29*   Coag's  Recent Labs Lab 11/03/13 0916 11/06/13 0445  INR 1.80* 1.83*   BMET  Recent Labs Lab 11/04/13 0350 11/05/13 0400 11/06/13 0445  NA 130* 129* 130*  K 4.4 3.8 3.4*  CL 93* 93* 95*  CO2 23 25 25   BUN 38* 34* 30*  CREATININE 1.48* 1.21 1.00  GLUCOSE 115* 124* 113*   Electrolytes  Recent Labs Lab 11/03/13 1200 11/04/13 0350 11/05/13 0400 11/06/13 0445  CALCIUM  --  7.5* 7.6* 7.4*  MG 1.8  --  1.9 1.8  PHOS 4.7*  --  3.8 2.7   Sepsis Markers  Recent Labs Lab 11/03/13 1155  11/03/13 1213 11/03/13 1600 11/04/13 0015 11/04/13 0350 11/05/13 0400  LATICACIDVEN  --   < > 5.23* 4.3* 3.4*  --   --  PROCALCITON 7.05  --   --   --   --  9.49 4.94  < > = values in this interval not displayed.  ABG No results found for this basename: PHART, PCO2ART, PO2ART,  in the last 168 hours Liver Enzymes  Recent Labs Lab 11/03/13 0916 11/04/13 0350  AST 50* 57*  ALT 20 23  ALKPHOS 53 45  BILITOT 3.1* 2.8*  ALBUMIN 1.6* 1.9*   Cardiac Enzymes  Recent Labs Lab 11/03/13 1200 11/03/13 1600 11/04/13 0015  TROPONINI 1.10* 1.29* 1.42*   Glucose  Recent Labs Lab 11/05/13 0731 11/05/13 1137 11/05/13 1520 11/05/13 1905 11/05/13 2345 11/06/13 0454  GLUCAP 105* 102* 111* 87 104* 102*   IMAGING:  Dg Chest Port 1 View  11/06/2013   CLINICAL DATA:  Respiratory distress.  EXAM: PORTABLE CHEST - 1 VIEW  COMPARISON:  11/03/2013   FINDINGS: Low lung volumes in this patient with extensive ascites. Normal heart size. No change in upper mediastinal contours. Layering left pleural effusion with increased hazy density of the left chest. Remote bilateral rib fractures. No pulmonary edema. No pneumothorax. Stable positioning of right IJ catheter, tip at the upper right atrium.  IMPRESSION: Interval increase or layering of a moderate left pleural effusion.   Electronically Signed   By: Jorje Guild M.D.   On: 11/06/2013 06:13   ASSESSMENT / PLAN:  PULMONARY A:   Lung hypoinflation from abdominal distension L effusion / hydrothorax with associated atelectasis, less likely pneumonia P:   Supplemental oxygen PRN Pulm Hygiene   CARDIOVASCULAR  A:  Septic Shock; doubt significant contribution hemorrhage Pericardial effusion small and non obstructive based on TTE.  P:  Goal MAP>60 Levophed gtt, weaned off Albumin if large volume paracentesis performed > suspect he is too tenuous at present to tolerate  RENAL Lab Results  Component Value Date   CREATININE 1.00 11/06/2013   CREATININE 1.21 11/05/2013   CREATININE 1.48* 11/04/2013     A:   AKI due to ATN, septic shock, hypovolemia P:   Goal CVP>10 Trend BMP Hold diuretics >restart 6/2  GASTROINTESTINAL A:   Hep B related cirrhosis / ascites Nausea / vomiting ( without overt hematemesis ), improved Doubt clinically significant GI hemorrhage Consider SBP as underlying cause sepsis Protein calorie malnutrition P:   GI input appreciated Diet Protonix bid Diagnostic para performed 6/1; results will affect type of SBP prophylaxis he will need in the future.   HEMATOLOGIC Lab Results  Component Value Date   INR 1.83* 11/06/2013   INR 1.80* 11/03/2013    A:   Thrombocytopenia >>> hypersplenism? sepsis? leukemia? bone marrow failure? Leucopenia >>> sepsis? leukemia? bone marrow failure? 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 Start ceftriaxone for klebsiella Paracentesis > no overt evidence SBP  ENDOCRINE  CBG (last 3)   Recent Labs  11/05/13 1905 11/05/13 2345 11/06/13 0454  GLUCAP 87 104* 102*     A:   Hypoglycemia in setting of liver disease  Adrenal insufficiency ruled out ( Cortisol 117 )  P:   CBG q4h Change IVF to D5ns 6/1  NEUROLOGIC A:   No active isses P:   No intervention required   6/2 global: resume gentle diuresis  As bp tolerates, change to SDU status  Richardson Landry Minor ACNP [facility] PCCM Pager [phone]  till 3 pm If no answer page [phone] 11/06/2013, 8:22 AM  Baltazar Apo, MD, PhD 11/06/2013, 11:30 AM [facility] [phone] or if no answer [phone]

## 2013-11-06 NOTE — Progress Notes (Addendum)
ANTIBIOTIC CONSULT NOTE - Follow Up  Pharmacy Consult for Zosyn Indication: rule out sepsis, Klebsiella bacteremia  No Known Allergies  Patient Measurements: Height: 6' (182.9 cm) Weight: 174 lb 9.7 oz (79.2 kg) IBW/kg (Calculated) : 77.6  Vital Signs: Temp: 98.2 F (36.8 C) (06/02 0600) Temp src: Core (Comment) (06/02 0500) BP: 107/75 mmHg (06/02 0600) Pulse Rate: 95 (06/02 0600) Intake/Output from previous day: 06/01 0701 - 06/02 0700 In: 2247.5 [P.O.:840; I.V.:1200; IV Piggyback:187.5] Out: 1000 [Urine:1000] Intake/Output from this shift:    Labs:  Recent Labs  11/04/13 0350 11/05/13 0400 11/06/13 0445  WBC 2.4* 2.2* 3.0*  HGB 13.7 10.8* 12.0*  PLT 64* 37* 29*  CREATININE 1.48* 1.21 1.00   Estimated Creatinine Clearance: 81.9 ml/min (by C-G formula based on Cr of 1). No results found for this basename: VANCOTROUGH, Corlis Leak, VANCORANDOM, GENTTROUGH, GENTPEAK, GENTRANDOM, TOBRATROUGH, TOBRAPEAK, TOBRARND, AMIKACINPEAK, AMIKACINTROU, AMIKACIN,  in the last 72 hours   Microbiology: Recent Results (from the past 720 hour(s))  CULTURE, BLOOD (ROUTINE X 2)     Status: None   Collection Time    11/03/13  9:16 AM      Result Value Ref Range Status   Specimen Description BLOOD RIGHT ANTECUBITAL   Final   Special Requests BOTTLES DRAWN AEROBIC ONLY 5CC   Final   Culture  Setup Time     Final   Value: 11/03/2013 14:45     Performed at [facility]   Culture     Final   Value: KLEBSIELLA PNEUMONIAE     Note: SUSCEPTIBILITIES PERFORMED ON PREVIOUS CULTURE WITHIN THE LAST 5 DAYS.     Note: Gram Stain Report Called to,Read Back By and Verified With: MEAGAN STOCKS @ 3:21AM 5.31.15 BY DESIS     Performed at [facility]   Report Status 11/06/2013 FINAL   Final  CULTURE, BLOOD (ROUTINE X 2)     Status: None   Collection Time    11/03/13  9:28 AM      Result Value Ref Range Status   Specimen Description BLOOD LEFT ANTECUBITAL   Final   Special Requests  BOTTLES DRAWN AEROBIC AND ANAEROBIC [facility] EACH   Final   Culture  Setup Time     Final   Value: 11/03/2013 14:45     Performed at [facility]   Culture     Final   Value: KLEBSIELLA PNEUMONIAE     Note: Gram Stain Report Called to,Read Back By and Verified With: MEAGAN STOCKS @ 3:21AM 5.31.15 BY DESIS     Performed at [facility]   Report Status 11/06/2013 FINAL   Final   Organism ID, Bacteria KLEBSIELLA PNEUMONIAE   Final  URINE CULTURE     Status: None   Collection Time    11/03/13 12:23 PM      Result Value Ref Range Status   Specimen Description URINE, CATHETERIZED   Final   Special Requests NONE   Final   Culture  Setup Time     Final   Value: 11/03/2013 19:00     Performed at [facility]     Final   Value: NO GROWTH     Performed at [facility]   Culture     Final   Value: NO GROWTH     Performed at [facility]   Report Status 11/04/2013 FINAL   Final  MRSA PCR SCREENING     Status: None  Collection Time    11/03/13  2:11 PM      Result Value Ref Range Status   MRSA by PCR NEGATIVE  NEGATIVE Final   Comment:            The GeneXpert MRSA Assay (FDA     approved for NASAL specimens     only), is one component of a     comprehensive MRSA colonization     surveillance program. It is not     intended to diagnose MRSA     infection nor to guide or     monitor treatment for     MRSA infections.  BODY FLUID CULTURE     Status: None   Collection Time    11/05/13 10:40 AM      Result Value Ref Range Status   Specimen Description PERITONEAL CAVITY   Final   Special Requests Immunocompromised   Final   Gram Stain     Final   Value: FEW WBC PRESENT,BOTH PMN AND MONONUCLEAR     NO ORGANISMS SEEN     Performed at [facility]   Culture PENDING   Incomplete   Report Status PENDING   Incomplete    Medical History: Past Medical History  Diagnosis Date  . Cancer   . Hepatitis B   . Cirrhosis     Assessment: 63 yo male presents to ER with weakness, emesis, hypotension. PMH includes CLL (per H&P last chemo July 2014), cirrhosis, hepatitis B. Patient with likely upper GI bleed per notes as spitting up black material frequently. Starting vancomycin and Zosyn for possible sepsis per pharmacy dosing.  Vancomycin discontinued when blood cultures grew GNR.  5/30 >> cefepime >> 5/30 5/30 >> vanc >> 6/1 5/30 >> zosyn >>   Tmax: AF WBCs: low Renal: SCr improving, CrCl 83 ml/min CG  5/30 blood x 2: 2/2 Klebsiella pneumoniae (R-Ampicillin, otherwise pan-sensitive) 5/30 urine: NGF 5/30 MRSA screen neg 6/1 peritoneal cavity fluid cx: pending, GS shows no organisms  Day #4 Zosyn EI for Klebsiella bacteremia, likely abdominal source.   Goal of Therapy:  Eradication of infection Doses adjusted per renal clearance  Plan:  1.  Continue Zosyn 3.375g IV q8h (4 hour infusion time).  2.  Consider narrowing therapy now that sensitivity data for blood cultures have resulted.  Hershal Coria, PharmD, BCPS Pager: [phone] 11/06/2013 8:33 AM

## 2013-11-07 ENCOUNTER — Inpatient Hospital Stay (HOSPITAL_COMMUNITY): Payer: Medicaid Other

## 2013-11-07 LAB — HEPATIC FUNCTION PANEL
ALK PHOS: 47 U/L (ref 39–117)
ALT: 19 U/L (ref 0–53)
AST: 31 U/L (ref 0–37)
Albumin: 2 g/dL — ABNORMAL LOW (ref 3.5–5.2)
BILIRUBIN DIRECT: 0.6 mg/dL — AB (ref 0.0–0.3)
BILIRUBIN INDIRECT: 1.5 mg/dL — AB (ref 0.3–0.9)
BILIRUBIN TOTAL: 2.1 mg/dL — AB (ref 0.3–1.2)
Total Protein: 3.5 g/dL — ABNORMAL LOW (ref 6.0–8.3)

## 2013-11-07 LAB — TYPE AND SCREEN
ABO/RH(D): O NEG
Antibody Screen: NEGATIVE
Unit division: 0

## 2013-11-07 LAB — CBC
HEMATOCRIT: 36.2 % — AB (ref 39.0–52.0)
Hemoglobin: 12.4 g/dL — ABNORMAL LOW (ref 13.0–17.0)
MCH: 31.6 pg (ref 26.0–34.0)
MCHC: 34.3 g/dL (ref 30.0–36.0)
MCV: 92.1 fL (ref 78.0–100.0)
Platelets: 26 10*3/uL — CL (ref 150–400)
RBC: 3.93 MIL/uL — ABNORMAL LOW (ref 4.22–5.81)
RDW: 15.7 % — AB (ref 11.5–15.5)
WBC: 3.6 10*3/uL — AB (ref 4.0–10.5)

## 2013-11-07 LAB — GLUCOSE, CAPILLARY
GLUCOSE-CAPILLARY: 125 mg/dL — AB (ref 70–99)
GLUCOSE-CAPILLARY: 127 mg/dL — AB (ref 70–99)
Glucose-Capillary: 127 mg/dL — ABNORMAL HIGH (ref 70–99)
Glucose-Capillary: 96 mg/dL (ref 70–99)
Glucose-Capillary: 100 mg/dL — ABNORMAL HIGH (ref 70–99)
Glucose-Capillary: 96 mg/dL (ref 70–99)

## 2013-11-07 LAB — BASIC METABOLIC PANEL
BUN: 28 mg/dL — ABNORMAL HIGH (ref 6–23)
CALCIUM: 7.5 mg/dL — AB (ref 8.4–10.5)
CO2: 24 mEq/L (ref 19–32)
Chloride: 96 mEq/L (ref 96–112)
Creatinine, Ser: 0.94 mg/dL (ref 0.50–1.35)
GFR calc Af Amer: >90 mL/min (ref >90)
GFR calc non Af Amer: 86 mL/min — ABNORMAL LOW (ref >90)
GLUCOSE: 124 mg/dL — AB (ref 70–99)
Potassium: 3.8 mEq/L (ref 3.7–5.3)
Sodium: 130 mEq/L — ABNORMAL LOW (ref 137–147)

## 2013-11-07 LAB — PHOSPHORUS: Phosphorus: 2.7 mg/dL (ref 2.3–4.6)

## 2013-11-07 LAB — MAGNESIUM: Magnesium: 1.9 mg/dL (ref 1.5–2.5)

## 2013-11-07 MED ORDER — ETOMIDATE 2 MG/ML IV SOLN
INTRAVENOUS | Status: AC
  Filled 2013-11-07: qty 20

## 2013-11-07 MED ORDER — ALBUMIN HUMAN 25 % IV SOLN
50.0000 g | Freq: Once | INTRAVENOUS | Status: AC
  Administered 2013-11-07: 50 g via INTRAVENOUS
  Filled 2013-11-07: qty 50

## 2013-11-07 MED ORDER — SUCCINYLCHOLINE CHLORIDE 20 MG/ML IJ SOLN
INTRAMUSCULAR | Status: AC
  Filled 2013-11-07: qty 1

## 2013-11-07 MED ORDER — ALBUMIN HUMAN 25 % IV SOLN
INTRAVENOUS | Status: AC
  Filled 2013-11-07: qty 50

## 2013-11-07 MED ORDER — ROCURONIUM BROMIDE 50 MG/5ML IV SOLN
INTRAVENOUS | Status: AC
  Filled 2013-11-07: qty 2

## 2013-11-07 MED ORDER — LIDOCAINE HCL (CARDIAC) 20 MG/ML IV SOLN
INTRAVENOUS | Status: AC
  Filled 2013-11-07: qty 5

## 2013-11-07 MED ORDER — ALBUMIN HUMAN 25 % IV SOLN
INTRAVENOUS | Status: AC
  Filled 2013-11-07: qty 150

## 2013-11-07 NOTE — Progress Notes (Signed)
[facility] Progress Note  Subjective: Patient feeling okay except for his symptomatic ascites, really no other complaints. Objective: Vital signs in last 24 hours: Temp:  [97.5 F (36.4 C)-98.6 F (37 C)] 97.7 F (36.5 C) (06/03 0800) Pulse Rate:  [88-103] 101 (06/03 0800) Resp:  [12-28] 23 (06/03 0800) BP: (97-125)/(71-76) 108/75 mmHg (06/03 0800) SpO2:  [93 %-97 %] 96 % (06/03 0800) Weight:  [80.1 kg (176 lb 9.4 oz)] 80.1 kg (176 lb 9.4 oz) (06/03 0400) Weight change: 0.9 kg (1 lb 15.8 oz)   PE: Abdomen symmetrically distended, semi-taut, nontender  Lab Results: Results for orders placed during the hospital encounter of 11/03/13 (from the past 24 hour(s))  GLUCOSE, CAPILLARY     Status: None   Collection Time    11/06/13  4:02 PM      Result Value Ref Range   Glucose-Capillary 88  70 - 99 mg/dL   Comment 1 Documented in Chart     Comment 2 Notify RN    GLUCOSE, CAPILLARY     Status: None   Collection Time    11/06/13  7:52 PM      Result Value Ref Range   Glucose-Capillary 95  70 - 99 mg/dL   Comment 1 Documented in Chart     Comment 2 Notify RN    GLUCOSE, CAPILLARY     Status: Abnormal   Collection Time    11/07/13 12:08 AM      Result Value Ref Range   Glucose-Capillary 125 (*) 70 - 99 mg/dL   Comment 1 Documented in Chart     Comment 2 Notify RN    GLUCOSE, CAPILLARY     Status: Abnormal   Collection Time    11/07/13  3:30 AM      Result Value Ref Range   Glucose-Capillary 127 (*) 70 - 99 mg/dL   Comment 1 Documented in Chart     Comment 2 Notify RN    BASIC METABOLIC PANEL     Status: Abnormal   Collection Time    11/07/13  4:00 AM      Result Value Ref Range   Sodium 130 (*) 137 - 147 mEq/L   Potassium 3.8  3.7 - 5.3 mEq/L   Chloride 96  96 - 112 mEq/L   CO2 24  19 - 32 mEq/L   Glucose, Bld 124 (*) 70 - 99 mg/dL   BUN 28 (*) 6 - 23 mg/dL   Creatinine, Ser 0.94  0.50 - 1.35 mg/dL   Calcium 7.5 (*) 8.4 - 10.5 mg/dL   GFR calc non Af Amer  86 (*) >90 mL/min   GFR calc Af Amer >90  >90 mL/min  MAGNESIUM     Status: None   Collection Time    11/07/13  4:00 AM      Result Value Ref Range   Magnesium 1.9  1.5 - 2.5 mg/dL  CBC     Status: Abnormal   Collection Time    11/07/13  4:00 AM      Result Value Ref Range   WBC 3.6 (*) 4.0 - 10.5 K/uL   RBC 3.93 (*) 4.22 - 5.81 MIL/uL   Hemoglobin 12.4 (*) 13.0 - 17.0 g/dL   HCT 36.2 (*) 39.0 - 52.0 %   MCV 92.1  78.0 - 100.0 fL   MCH 31.6  26.0 - 34.0 pg   MCHC 34.3  30.0 - 36.0 g/dL   RDW 15.7 (*) 11.5 - 15.5 %  Platelets 26 (*) 150 - 400 K/uL  PHOSPHORUS     Status: None   Collection Time    11/07/13  4:00 AM      Result Value Ref Range   Phosphorus 2.7  2.3 - 4.6 mg/dL  HEPATIC FUNCTION PANEL     Status: Abnormal   Collection Time    11/07/13  4:00 AM      Result Value Ref Range   Total Protein 3.5 (*) 6.0 - 8.3 g/dL   Albumin 2.0 (*) 3.5 - 5.2 g/dL   AST 31  0 - 37 U/L   ALT 19  0 - 53 U/L   Alkaline Phosphatase 47  39 - 117 U/L   Total Bilirubin 2.1 (*) 0.3 - 1.2 mg/dL   Bilirubin, Direct 0.6 (*) 0.0 - 0.3 mg/dL   Indirect Bilirubin 1.5 (*) 0.3 - 0.9 mg/dL  GLUCOSE, CAPILLARY     Status: None   Collection Time    11/07/13  7:29 AM      Result Value Ref Range   Glucose-Capillary 96  70 - 99 mg/dL   Comment 1 Documented in Chart     Comment 2 Notify RN      Studies/Results: Dg Chest Port 1 View  11/06/2013   CLINICAL DATA:  Respiratory distress.  EXAM: PORTABLE CHEST - 1 VIEW  COMPARISON:  11/03/2013  FINDINGS: Low lung volumes in this patient with extensive ascites. Normal heart size. No change in upper mediastinal contours. Layering left pleural effusion with increased hazy density of the left chest. Remote bilateral rib fractures. No pulmonary edema. No pneumothorax. Stable positioning of right IJ catheter, tip at the upper right atrium.  IMPRESSION: Interval increase or layering of a moderate left pleural effusion.   Electronically Signed   By: Jorje Guild  M.D.   On: 11/06/2013 06:13      Assessment: Hepatitis B with cirrhosis'  Increasingly refractory ascites secondary to hepatitis.  Gram-negative bacteremia , paracentesis does not suggest SBP as source of current bacteremia  CLL  No evidence of GI bleeding Urine culture negative   Plan: Will proceed with large volume paracentesis up to 4 L with IV albumin given beforehand. Continue antibiotics for Klebsiella bacteremia, source unclear    Missy Sabins 11/07/2013, 11:35 AM

## 2013-11-07 NOTE — Progress Notes (Signed)
PULMONARY / CRITICAL CARE MEDICINE  Name: Tyler Huang MRN: [id number] DOB: 22-Sep-1950    ADMISSION DATE:  11/03/2013 CONSULTATION DATE:  11/03/2013  REFERRING MD :  EDP PRIMARY SERVICE:  PCCM  CHIEF COMPLAINT:  Hypotension  BRIEF PATIENT DESCRIPTION: 63 yo with Hep B cirrhosis ( no alcohol use ) on multiple diuretics preadmission, treated for CLL ( outside facility ) in 2014 ( now in remission ) presenting to [facility] ED on 5/30 with weakness, nausea, vomiting and hypotension since the night before admission. Hypotensive, requiring CVL placement and Levophed. Noted braun emesis, no overt hematemesis.  SIGNIFICANT EVENTS / STUDIES:  5/30  Abdomen / pelvis CT >>> Ascites, small gallstones, left pleural effusion and airspace disease 5/30  GI evaluation >>> significant GI hemorrhage unlikely, no indications for EGD 5/31  TTE >>>nl lv func. Ef 60%, small pericardial effusion. 6/1 diagnostic para>>wbc 136, neutrophil 32, no organisms seen   LINES / TUBES: R IJ CVL 5/30 >>> Foley 5/30 >>>  CULTURES: 5/30 Blood >>> GNR >> Klebsiella 5/30 Urine >>>neg 6/1 body fluid>>NGTD>>  ANTIBIOTICS: Zosyn 5/30 >>> 6/2 Vancomycin 5/30 >>> 6/1 Ceftriaxone 6/2 >>   INTERVAL HISTORY:  "I need a tap so i can eat".  VITAL SIGNS: Temp:  [97.5 F (36.4 C)-98.6 F (37 C)] 98 F (36.7 C) (06/03 0400) Pulse Rate:  [88-103] 95 (06/03 0400) Resp:  [12-30] 23 (06/03 0400) BP: (97-125)/(69-80) 97/71 mmHg (06/03 0400) SpO2:  [93 %-97 %] 93 % (06/03 0400) Weight:  [80.1 kg (176 lb 9.4 oz)] 80.1 kg (176 lb 9.4 oz) (06/03 0400)  HEMODYNAMICS: CVP:  [4 mmHg-8 mmHg] 8 mmHg  VENTILATOR SETTINGS:   INTAKE / OUTPUT: Intake/Output     06/02 0701 - 06/03 0700 06/03 0701 - 06/04 0700   P.O. 360    I.V. (mL/kg) 1150 (14.4)    Other     IV Piggyback 62.5    Total Intake(mL/kg) 1572.5 (19.6)    Urine (mL/kg/hr) 275 (0.1)    Total Output 275     Net +1297.5          Urine Occurrence 3 x    Stool Occurrence  4 x     PHYSICAL EXAMINATION: General:  Appears ill, no distress, off o2 Neuro:  Awake, alert, mae x 4 HEENT:  Temporal wasting, dry membranes Cardiovascular:  Regular, no murmurs Lungs:  Bilateral air entry, no added sounds. Diminished in bases. Abdomen:  More distended, mild generalized tenderness,+++ ascites to percussion, not tense. bandaid add para site  Musculoskeletal:  Moves all extremities, + le  Edema and rt ue edema Skin:  Multiple ecchymoses   LABS: CBC  Recent Labs Lab 11/05/13 0400 11/06/13 0445 11/07/13 0400  WBC 2.2* 3.0* 3.6*  HGB 10.8* 12.0* 12.4*  HCT 31.6* 34.8* 36.2*  PLT 37* 29* 26*   Coag's  Recent Labs Lab 11/03/13 0916 11/06/13 0445  INR 1.80* 1.83*   BMET  Recent Labs Lab 11/05/13 0400 11/06/13 0445 11/07/13 0400  NA 129* 130* 130*  K 3.8 3.4* 3.8  CL 93* 95* 96  CO2 25 25 24   BUN 34* 30* 28*  CREATININE 1.21 1.00 0.94  GLUCOSE 124* 113* 124*   Electrolytes  Recent Labs Lab 11/05/13 0400 11/06/13 0445 11/07/13 0400  CALCIUM 7.6* 7.4* 7.5*  MG 1.9 1.8 1.9  PHOS 3.8 2.7 2.7   Sepsis Markers  Recent Labs Lab 11/03/13 1155  11/03/13 1213 11/03/13 1600 11/04/13 0015 11/04/13 0350 11/05/13 0400  LATICACIDVEN  --   < >  5.23* 4.3* 3.4*  --   --   PROCALCITON 7.05  --   --   --   --  9.49 4.94  < > = values in this interval not displayed.  ABG No results found for this basename: PHART, PCO2ART, PO2ART,  in the last 168 hours Liver Enzymes  Recent Labs Lab 11/03/13 0916 11/04/13 0350  AST 50* 57*  ALT 20 23  ALKPHOS 53 45  BILITOT 3.1* 2.8*  ALBUMIN 1.6* 1.9*   Cardiac Enzymes  Recent Labs Lab 11/03/13 1200 11/03/13 1600 11/04/13 0015  TROPONINI 1.10* 1.29* 1.42*   Glucose  Recent Labs Lab 11/06/13 0730 11/06/13 1125 11/06/13 1602 11/06/13 1952 11/07/13 0008 11/07/13 0330  GLUCAP 96 117* 88 95 125* 127*   IMAGING:  Dg Chest Port 1 View  11/06/2013   CLINICAL DATA:  Respiratory distress.   EXAM: PORTABLE CHEST - 1 VIEW  COMPARISON:  11/03/2013  FINDINGS: Low lung volumes in this patient with extensive ascites. Normal heart size. No change in upper mediastinal contours. Layering left pleural effusion with increased hazy density of the left chest. Remote bilateral rib fractures. No pulmonary edema. No pneumothorax. Stable positioning of right IJ catheter, tip at the upper right atrium.  IMPRESSION: Interval increase or layering of a moderate left pleural effusion.   Electronically Signed   By: Jorje Guild M.D.   On: 11/06/2013 06:13   ASSESSMENT / PLAN:  PULMONARY A:   Lung hypoinflation from abdominal distension L effusion / hydrothorax with associated atelectasis, less likely pneumonia P:   Supplemental oxygen PRN Pulm Hygiene  Paracentesis would allow better diaphragm excursion.   CARDIOVASCULAR  A:  Septic Shock; doubt significant contribution hemorrhage Pericardial effusion small and non obstructive based on TTE.  P:  Goal MAP>60 Levophed gtt, weaned off, 6/2 aldactone re instituted, remains off demadex Albumin if large volume paracentesis performed .   RENAL Lab Results  Component Value Date   CREATININE 0.94 11/07/2013   CREATININE 1.00 11/06/2013   CREATININE 1.21 11/05/2013     A:   AKI due to ATN, septic shock, hypovolemia P:   Goal CVP>10 Trend BMP Hold diuretics >restart 6/2  GASTROINTESTINAL A:   Hep B related cirrhosis / ascites Nausea / vomiting ( without overt hematemesis ), improved Doubt clinically significant GI hemorrhage Consider SBP as underlying cause sepsis Protein calorie malnutrition P:   GI input appreciated Diet Protonix bid Diagnostic para performed 6/1; results will affect type of SBP prophylaxis he will need in the future. 6/3 NGTD Large volume paracentesis on 6/3 with albumin administration  HEMATOLOGIC Lab Results  Component Value Date   INR 1.83* 11/06/2013   INR 1.80* 11/03/2013    A:   Thrombocytopenia >>>  hypersplenism? sepsis? leukemia? bone marrow failure? Leucopenia >>> sepsis? leukemia? bone marrow failure? 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 Started ceftriaxone for klebsiella 6/1 Paracentesis > no overt evidence SBP  ENDOCRINE  CBG (last 3)   Recent Labs  11/06/13 1952 11/07/13 0008 11/07/13 0330  GLUCAP 95 125* 127*   A:   Hypoglycemia in setting of liver disease  Adrenal insufficiency ruled out ( Cortisol 117 )  P:   CBG q4h Change IVF to D5ns 6/1  NEUROLOGIC A:   No active isses P:   No intervention required  6/2 global: resume gentle diuresis  As bp tolerates, changed to SDU status. 6/3 Consider large volume paracentesis with albumin infusion IV, if tolerates we can move to floor with goal of dc 1-2 days if stable.  Richardson Landry Minor ACNP [facility] PCCM Pager [phone] till 3 pm If no answer page [phone] 11/07/2013, 8:21 AM   Baltazar Apo, MD, PhD 11/07/2013, 1:29 PM [facility] [phone] or if no answer [phone]

## 2013-11-07 NOTE — Procedures (Signed)
Successful US guided paracentesis from RLQ.  Yielded 4 Liters of clear yellow fluid.  No immediate complications.  Pt tolerated well.   Specimen was not sent for labs.  Ascencion Dike PA-C 11/07/2013 3:27 PM

## 2013-11-07 NOTE — Progress Notes (Signed)
CSW received referral for New SNF.  CSW attempted to meet with pt at bedside, but pt currently not in room and RN reports that pt currently in Interventional Radiology for paracentesis.  CSW to follow up at a later time to assess for SNF placement.  Alison Murray, MSW, [facility] [phone]

## 2013-11-08 ENCOUNTER — Inpatient Hospital Stay (HOSPITAL_COMMUNITY): Payer: Medicaid Other

## 2013-11-08 LAB — BASIC METABOLIC PANEL WITH GFR
BUN: 27 mg/dL — ABNORMAL HIGH (ref 6–23)
CO2: 25 meq/L (ref 19–32)
Calcium: 7.4 mg/dL — ABNORMAL LOW (ref 8.4–10.5)
Chloride: 97 meq/L (ref 96–112)
Creatinine, Ser: 0.84 mg/dL (ref 0.50–1.35)
GFR calc Af Amer: >90 mL/min
GFR calc non Af Amer: >90 mL/min
Glucose, Bld: 94 mg/dL (ref 70–99)
Potassium: 3.9 meq/L (ref 3.7–5.3)
Sodium: 131 meq/L — ABNORMAL LOW (ref 137–147)

## 2013-11-08 LAB — CBC
HCT: 31.4 % — ABNORMAL LOW (ref 39.0–52.0)
Hemoglobin: 10.8 g/dL — ABNORMAL LOW (ref 13.0–17.0)
MCH: 31.7 pg (ref 26.0–34.0)
MCHC: 34.4 g/dL (ref 30.0–36.0)
MCV: 92.1 fL (ref 78.0–100.0)
PLATELETS: 26 10*3/uL — AB (ref 150–400)
RBC: 3.41 MIL/uL — AB (ref 4.22–5.81)
RDW: 15.9 % — ABNORMAL HIGH (ref 11.5–15.5)
WBC: 2.2 10*3/uL — ABNORMAL LOW (ref 4.0–10.5)

## 2013-11-08 LAB — GLUCOSE, CAPILLARY
GLUCOSE-CAPILLARY: 115 mg/dL — AB (ref 70–99)
Glucose-Capillary: 93 mg/dL (ref 70–99)
Glucose-Capillary: 103 mg/dL — ABNORMAL HIGH (ref 70–99)

## 2013-11-08 LAB — AMMONIA: Ammonia: 17 umol/L (ref 11–60)

## 2013-11-08 LAB — PROTIME-INR
INR: 1.82 — ABNORMAL HIGH (ref 0.00–1.49)
Prothrombin Time: 20.5 seconds — ABNORMAL HIGH (ref 11.6–15.2)

## 2013-11-08 LAB — BODY FLUID CULTURE: CULTURE: NO GROWTH

## 2013-11-08 LAB — MAGNESIUM: MAGNESIUM: 1.8 mg/dL (ref 1.5–2.5)

## 2013-11-08 LAB — LACTIC ACID, PLASMA: Lactic Acid, Venous: 3.4 mmol/L — ABNORMAL HIGH (ref 0.5–2.2)

## 2013-11-08 LAB — PHOSPHORUS: PHOSPHORUS: 2.1 mg/dL — AB (ref 2.3–4.6)

## 2013-11-08 LAB — PROCALCITONIN: PROCALCITONIN: 0.85 ng/mL

## 2013-11-08 MED ORDER — PANTOPRAZOLE SODIUM 40 MG PO TBEC
40.0000 mg | DELAYED_RELEASE_TABLET | Freq: Two times a day (BID) | ORAL | Status: DC
Start: 2013-11-08 — End: 2013-11-14
  Administered 2013-11-08 – 2013-11-14 (×13): 40 mg via ORAL
  Filled 2013-11-08 (×14): qty 1

## 2013-11-08 MED ORDER — FUROSEMIDE 20 MG PO TABS
20.0000 mg | ORAL_TABLET | Freq: Every day | ORAL | Status: DC
  Administered 2013-11-08 – 2013-11-13 (×5): 20 mg via ORAL
  Filled 2013-11-08 (×6): qty 1

## 2013-11-08 NOTE — Progress Notes (Signed)
Clinical Social Work Department BRIEF PSYCHOSOCIAL ASSESSMENT 11/08/2013  Patient:  Tyler Huang,Tyler Huang     Account Number:  [id number]     Admit date:  11/03/2013  Clinical Social Worker:  Ulyess Blossom  Date/Time:  11/08/2013 03:00 PM  Referred by:  Physician  Date Referred:  11/08/2013 Referred for  SNF Placement   Other Referral:   Interview type:  Patient Other interview type:   and patient sister at bedside    PSYCHOSOCIAL DATA Living Status:  FAMILY Admitted from facility:   Level of care:   Primary support name:  Jan Kirven/sister/[id number] Primary support relationship to patient:  SIBLING Degree of support available:   strong    CURRENT CONCERNS Current Concerns  Post-Acute Placement   Other Concerns:    SOCIAL WORK ASSESSMENT / PLAN CSW received referral for New SNF.    CSW met with pt and pt sister, Jan at bedside. Pt niece also present at this time. CSW introduced self and explained role. CSW discussed recommendation for short term rehab at [facility] before returning home. Pt expressed understanding and stated that he feels very weak. Pt sister shared that pt and pt sister are hopeful to build pt strength because pt has an appointment at [facility] for evaluation for liver transplant. Pt sister shared that pt applied for Medicaid in April and stated that pt Medicaid caseworker is Rojelio Brenner. Pt and pt sister are agreeable to [facility] and [facility] search and understanding that pt options may be limited due to pt awaiting Medicaid approval. Pt and pt sister expressed understanding.    CSW completed FL2 and initiated SNF search to [facility] and [employer].    CSW contacted [place] and was able to obtain pt reference number for Medicaid application.    CSW to follow up with pt and pt sister regarding SNF bed offers.    CSW to facilitate pt discharge needs when pt medically ready for discharge.   Assessment/plan status:   Psychosocial Support/Ongoing Assessment of Needs Other assessment/ plan:   discharge planning   Information/referral to community resources:   [place] and [facility] list    PATIENT'S/FAMILY'S RESPONSE TO PLAN OF CARE: Pt alert and oriented x 4. Pt sister supportive and actively involved in pt care. Pt is eager to get stronger in order to be strong enough for appointment in mid June at [facility] for evaluation for liver transplant. Pt and pt sister understanding that pt may have limited options due to pt Medicaid pending.    Alison Murray, MSW, [place] Work [phone]

## 2013-11-08 NOTE — Progress Notes (Signed)
[facility] Progress Note  Subjective: Feels weak although didn't tolerate diet better after paracentesis and was able to walk in the halls on today. No nausea.  Objective: Vital signs in last 24 hours: Temp:  [97.4 F (36.3 C)-97.8 F (36.6 C)] 97.8 F (36.6 C) (06/04 0800) Pulse Rate:  [68-126] 126 (06/04 0925) Resp:  [13-33] 26 (06/04 0900) BP: (80-113)/(50-71) 107/71 mmHg (06/04 0800) SpO2:  [90 %-98 %] 97 % (06/04 0900) Weight:  [75.6 kg (166 lb 10.7 oz)] 75.6 kg (166 lb 10.7 oz) (06/04 0400) Weight change: -4.5 kg (-9 lb 14.7 oz)   PE: Weak,  Cachectic, up in chair alert and oriented abdomen soft symmetrically distended slightly less so than yesterday.  Lab Results: Results for orders placed during the hospital encounter of 11/03/13 (from the past 24 hour(s))  GLUCOSE, CAPILLARY     Status: None   Collection Time    11/07/13  3:46 PM      Result Value Ref Range   Glucose-Capillary 96  70 - 99 mg/dL  GLUCOSE, CAPILLARY     Status: Abnormal   Collection Time    11/07/13  7:51 PM      Result Value Ref Range   Glucose-Capillary 127 (*) 70 - 99 mg/dL   Comment 1 Notify RN    CBC     Status: Abnormal   Collection Time    11/08/13  4:33 AM      Result Value Ref Range   WBC 2.2 (*) 4.0 - 10.5 K/uL   RBC 3.41 (*) 4.22 - 5.81 MIL/uL   Hemoglobin 10.8 (*) 13.0 - 17.0 g/dL   HCT 31.4 (*) 39.0 - 52.0 %   MCV 92.1  78.0 - 100.0 fL   MCH 31.7  26.0 - 34.0 pg   MCHC 34.4  30.0 - 36.0 g/dL   RDW 15.9 (*) 11.5 - 15.5 %   Platelets 26 (*) 150 - 400 K/uL  BASIC METABOLIC PANEL     Status: Abnormal   Collection Time    11/08/13  4:33 AM      Result Value Ref Range   Sodium 131 (*) 137 - 147 mEq/L   Potassium 3.9  3.7 - 5.3 mEq/L   Chloride 97  96 - 112 mEq/L   CO2 25  19 - 32 mEq/L   Glucose, Bld 94  70 - 99 mg/dL   BUN 27 (*) 6 - 23 mg/dL   Creatinine, Ser 0.84  0.50 - 1.35 mg/dL   Calcium 7.4 (*) 8.4 - 10.5 mg/dL   GFR calc non Af Amer >90  >90 mL/min   GFR  calc Af Amer >90  >90 mL/min  PHOSPHORUS     Status: Abnormal   Collection Time    11/08/13  4:33 AM      Result Value Ref Range   Phosphorus 2.1 (*) 2.3 - 4.6 mg/dL  MAGNESIUM     Status: None   Collection Time    11/08/13  4:33 AM      Result Value Ref Range   Magnesium 1.8  1.5 - 2.5 mg/dL  PROTIME-INR     Status: Abnormal   Collection Time    11/08/13  4:33 AM      Result Value Ref Range   Prothrombin Time 20.5 (*) 11.6 - 15.2 seconds   INR 1.82 (*) 0.00 - 1.49  GLUCOSE, CAPILLARY     Status: None   Collection Time    11/08/13  5:29 AM  Result Value Ref Range   Glucose-Capillary 93  70 - 99 mg/dL  GLUCOSE, CAPILLARY     Status: Abnormal   Collection Time    11/08/13  8:06 AM      Result Value Ref Range   Glucose-Capillary 115 (*) 70 - 99 mg/dL  PROCALCITONIN     Status: None   Collection Time    11/08/13  9:25 AM      Result Value Ref Range   Procalcitonin 0.85    LACTIC ACID, PLASMA     Status: Abnormal   Collection Time    11/08/13  9:25 AM      Result Value Ref Range   Lactic Acid, Venous 3.4 (*) 0.5 - 2.2 mmol/L  AMMONIA     Status: None   Collection Time    11/08/13  9:50 AM      Result Value Ref Range   Ammonia 17  11 - 60 umol/L    Studies/Results: US Paracentesis  11/07/2013   CLINICAL DATA:  Hepatitis, large volume ascites. Request therapeutic paracentesis of up to 4 L max.  EXAM: ULTRASOUND GUIDED PARACENTESIS  COMPARISON:  None.  PROCEDURE: An ultrasound guided paracentesis was thoroughly discussed with the patient and questions answered. The benefits, risks, alternatives and complications were also discussed. The patient understands and wishes to proceed with the procedure. Written consent was obtained.  Ultrasound was performed to localize and mark an adequate pocket of fluid in the right lower quadrant of the abdomen. The area was then prepped and draped in the normal sterile fashion. 1% Lidocaine was used for local anesthesia. Under ultrasound  guidance a 19 gauge Yueh catheter was introduced. Paracentesis was performed. The catheter was removed and a dressing applied.  COMPLICATIONS: None immediate  FINDINGS: A total of approximately 4 L of clear yellow fluid was removed. A fluid sample was not sent for laboratory analysis.  IMPRESSION: Successful ultrasound guided paracentesis yielding 4 L of ascites.  Read by Ascencion Dike PA-C   Electronically Signed   By: Markus Daft M.D.   On: 11/07/2013 15:30   Dg Chest Port 1 View  11/08/2013   CLINICAL DATA:  Respiratory distress.  EXAM: PORTABLE CHEST - 1 VIEW  COMPARISON:  11/06/2013.  FINDINGS: The right IJ central line is unchanged. Bilateral pleural effusions are present which appear to layer subpulmonic. Basilar atelectasis. Airspace disease and atelectasis in the left lung is unchanged. Compared to the prior exam 11/06/2013 at 0446 hr, there is little if any interval change.  IMPRESSION: No interval change.   Electronically Signed   By: Dereck Ligas M.D.   On: 11/08/2013 07:02      Assessment:  1. Hepatitis B with end-stage cirrhosis 2. Klebsiella bacteremia, source unclear 3. CLL supposedly in remission 4. Ascites, status post large volume paracentesis 5. Pancytopenia 6. Deconditioning  Plan:  1. Continue antibiotics 2. Will reinitiate diuretics with spironolactone and furosemide 3. Watch for signs of GI bleeding which so far is not been in evidence 4. Advance diet and activity as tolerated 1.      Tyler Huang 11/08/2013, 12:23 PM

## 2013-11-08 NOTE — Progress Notes (Signed)
PULMONARY / CRITICAL CARE MEDICINE  Name: Tyler Huang MRN: [id number] DOB: December 12, 1950    ADMISSION DATE:  11/03/2013 CONSULTATION DATE:  11/03/2013  REFERRING MD :  EDP PRIMARY SERVICE:  PCCM  CHIEF COMPLAINT:  Hypotension  BRIEF PATIENT DESCRIPTION: 63 yo with Hep B cirrhosis ( no alcohol use ) on multiple diuretics preadmission, treated for CLL ( outside facility ) in 2014 ( now in remission ) presenting to [facility] ED on 5/30 with weakness, nausea, vomiting and hypotension since the night before admission. Hypotensive, requiring CVL placement and Levophed. Noted braun emesis, no overt hematemesis.  SIGNIFICANT EVENTS / STUDIES:  5/30  Abdomen / pelvis CT >>> Ascites, small gallstones, left pleural effusion and airspace disease 5/30  GI evaluation >>> significant GI hemorrhage unlikely, no indications for EGD 5/31  TTE >>>nl lv func. Ef 60%, small pericardial effusion. 6/1 diagnostic para>>wbc 136, neutrophil 32, no organisms seen  6/3 paracentesis 4 l per IR   LINES / TUBES: R IJ CVL 5/30 >>> Foley 5/30 >>>  CULTURES: 5/30 Blood >>> GNR >> Klebsiella 5/30 Urine >>>neg 6/1 body fluid>>NGTD>>  ANTIBIOTICS: Zosyn 5/30 >>> 6/2 Vancomycin 5/30 >>> 6/1 Ceftriaxone 6/2 >>   INTERVAL HISTORY:  Disappointed IR only took off 4 l with paracentesis.  VITAL SIGNS: Temp:  [97.4 F (36.3 C)-97.6 F (36.4 C)] 97.5 F (36.4 C) (06/04 0400) Pulse Rate:  [68-110] 87 (06/04 0600) Resp:  [13-33] 15 (06/04 0600) BP: (80-113)/(50-69) 113/64 mmHg (06/04 0600) SpO2:  [92 %-98 %] 92 % (06/04 0600) Weight:  [75.6 kg (166 lb 10.7 oz)] 75.6 kg (166 lb 10.7 oz) (06/04 0400)  HEMODYNAMICS: CVP:  [2 mmHg-10 mmHg] 2 mmHg  VENTILATOR SETTINGS:   INTAKE / OUTPUT: Intake/Output     06/03 0701 - 06/04 0700 06/04 0701 - 06/05 0700   P.O.     I.V. (mL/kg) 351.3 (4.6)    IV Piggyback 250    Total Intake(mL/kg) 601.3 (8)    Urine (mL/kg/hr) 400 (0.2)    Total Output 400     Net +201.3           Urine Occurrence 2 x    Stool Occurrence 2 x     PHYSICAL EXAMINATION: General:  Appears ill, no distress, off o2 Neuro:  Awake, alert, mae x 4 HEENT:  Temporal wasting, dry membranes Cardiovascular:  Regular, no murmurs Lungs:  Bilateral air entry, no added sounds. Diminished in bases. Abdomen:  Less distended, mild generalized tenderness,++ ascites to percussion, not tense.  Musculoskeletal:  Moves all extremities, + le  Edema and rt ue edema, decreased Skin:  Multiple ecchymoses   LABS: CBC  Recent Labs Lab 11/06/13 0445 11/07/13 0400 11/08/13 0433  WBC 3.0* 3.6* 2.2*  HGB 12.0* 12.4* 10.8*  HCT 34.8* 36.2* 31.4*  PLT 29* 26* 26*   Coag's  Recent Labs Lab 11/03/13 0916 11/06/13 0445 11/08/13 0433  INR 1.80* 1.83* 1.82*   BMET  Recent Labs Lab 11/06/13 0445 11/07/13 0400 11/08/13 0433  NA 130* 130* 131*  K 3.4* 3.8 3.9  CL 95* 96 97  CO2 25 24 25   BUN 30* 28* 27*  CREATININE 1.00 0.94 0.84  GLUCOSE 113* 124* 94   Electrolytes  Recent Labs Lab 11/06/13 0445 11/07/13 0400 11/08/13 0433  CALCIUM 7.4* 7.5* 7.4*  MG 1.8 1.9 1.8  PHOS 2.7 2.7 2.1*   Sepsis Markers  Recent Labs Lab 11/03/13 1155  11/03/13 1213 11/03/13 1600 11/04/13 0015 11/04/13 0350 11/05/13 0400  LATICACIDVEN  --   < >  5.23* 4.3* 3.4*  --   --   PROCALCITON 7.05  --   --   --   --  9.49 4.94  < > = values in this interval not displayed.  ABG No results found for this basename: PHART, PCO2ART, PO2ART,  in the last 168 hours Liver Enzymes  Recent Labs Lab 11/03/13 0916 11/04/13 0350 11/07/13 0400  AST 50* 57* 31  ALT 20 23 19   ALKPHOS 53 45 47  BILITOT 3.1* 2.8* 2.1*  ALBUMIN 1.6* 1.9* 2.0*   Cardiac Enzymes  Recent Labs Lab 11/03/13 1200 11/03/13 1600 11/04/13 0015  TROPONINI 1.10* 1.29* 1.42*   Glucose  Recent Labs Lab 11/07/13 0729 11/07/13 1203 11/07/13 1546 11/07/13 1951 11/08/13 0529 11/08/13 0806  GLUCAP 96 100* 96 127* 93 115*    IMAGING:  US Paracentesis  11/07/2013   CLINICAL DATA:  Hepatitis, large volume ascites. Request therapeutic paracentesis of up to 4 L max.  EXAM: ULTRASOUND GUIDED PARACENTESIS  COMPARISON:  None.  PROCEDURE: An ultrasound guided paracentesis was thoroughly discussed with the patient and questions answered. The benefits, risks, alternatives and complications were also discussed. The patient understands and wishes to proceed with the procedure. Written consent was obtained.  Ultrasound was performed to localize and mark an adequate pocket of fluid in the right lower quadrant of the abdomen. The area was then prepped and draped in the normal sterile fashion. 1% Lidocaine was used for local anesthesia. Under ultrasound guidance a 19 gauge Yueh catheter was introduced. Paracentesis was performed. The catheter was removed and a dressing applied.  COMPLICATIONS: None immediate  FINDINGS: A total of approximately 4 L of clear yellow fluid was removed. A fluid sample was not sent for laboratory analysis.  IMPRESSION: Successful ultrasound guided paracentesis yielding 4 L of ascites.  Read by Ascencion Dike PA-C   Electronically Signed   By: Markus Daft M.D.   On: 11/07/2013 15:30   Dg Chest Port 1 View  11/08/2013   CLINICAL DATA:  Respiratory distress.  EXAM: PORTABLE CHEST - 1 VIEW  COMPARISON:  11/06/2013.  FINDINGS: The right IJ central line is unchanged. Bilateral pleural effusions are present which appear to layer subpulmonic. Basilar atelectasis. Airspace disease and atelectasis in the left lung is unchanged. Compared to the prior exam 11/06/2013 at 0446 hr, there is little if any interval change.  IMPRESSION: No interval change.   Electronically Signed   By: Dereck Ligas M.D.   On: 11/08/2013 07:02   ASSESSMENT / PLAN:  PULMONARY A:   Lung hypoinflation from abdominal distension L effusion / hydrothorax with associated atelectasis, less likely pneumonia P:   Supplemental oxygen PRN(not  needed) Pulm Hygiene    CARDIOVASCULAR  A:  Septic Shock; doubt significant contribution hemorrhage Pericardial effusion small and non obstructive based on TTE.  P:  Goal MAP>60 Levophed gtt, weaned off, 6/2 aldactone re instituted, remains off demadex but cvp 2 on 6/4 Albumin given prior to large volume paracentesis 6/3.   RENAL Lab Results  Component Value Date   CREATININE 0.84 11/08/2013   CREATININE 0.94 11/07/2013   CREATININE 1.00 11/06/2013     A:   AKI due to ATN, septic shock, hypovolemia P:   Goal CVP>10 Trend BMP Hold diuretics >restart 6/2(aldactone)6/4 ? Restart demadex  GASTROINTESTINAL A:   Hep B related cirrhosis / ascites Nausea / vomiting ( without overt hematemesis ), improved Doubt clinically significant GI hemorrhage Consider SBP as underlying cause sepsis Protein calorie malnutrition P:  GI input appreciated Diet Protonix bid Diagnostic para performed 6/1; results will affect type of SBP prophylaxis he will need in the future. 6/4 NGTD Large volume paracentesis 4 l  on 6/3 with albumin administration  HEMATOLOGIC Lab Results  Component Value Date   INR 1.82* 11/08/2013   INR 1.83* 11/06/2013   INR 1.80* 11/03/2013    A:   Thrombocytopenia >>> hypersplenism? sepsis? leukemia? bone marrow failure? Leucopenia >>> sepsis? leukemia? bone marrow failure? 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 Started ceftriaxone for klebsiella 6/1 Paracentesis > no overt evidence SBP  ENDOCRINE  CBG (last 3)   Recent Labs  11/07/13 1951 11/08/13 0529 11/08/13 0806  GLUCAP 127* 93 115*   A:   Hypoglycemia in setting of liver disease  Adrenal insufficiency ruled out ( Cortisol 117 )  P:   CBG q4h Change IVF to D5ns 6/1  NEUROLOGIC A:   No active isses P:   No intervention required   6/2 global:  resume gentle diuresis  As bp tolerates, changed to SDU status. 6/3 4 l large volume paracentesis with albumin infusion IV, if tolerates we can move to floor with goal of dc 1-2 days if stable. 6/4 consider tx to floor. Plts/wbc no improvement.  Richardson Landry Minor ACNP Maryanna Shape PCCM Pager [phone] till 3 pm If no answer page [phone] 11/08/2013, 8:19 AM   Baltazar Apo, MD, PhD  Pulmonary and Critical Care [phone] or if no answer [phone]

## 2013-11-08 NOTE — Progress Notes (Signed)
Clinical Social Work Department CLINICAL SOCIAL WORK PLACEMENT NOTE 11/08/2013  Patient:  MEET, [place]  Account Number:  [id number] Admit date:  11/03/2013  Clinical Social Worker:  Ulyess Blossom  Date/time:  11/08/2013 04:30 PM  Clinical Social Work is seeking post-discharge placement for this patient at the following level of care:   SKILLED NURSING   (*CSW will update this form in Epic as items are completed)   11/08/2013  Patient/family provided with [place] Department of Clinical Social Work's list of facilities offering this level of care within the geographic area requested by the patient (or if unable, by the patient's family).  11/08/2013  Patient/family informed of their freedom to choose among providers that offer the needed level of care, that participate in Medicare, Medicaid or managed care program needed by the patient, have an available bed and are willing to accept the patient.  11/08/2013  Patient/family informed of [facility]' ownership interest in [facility], as well as of the fact that they are under no obligation to receive care at this facility.  PASARR submitted to EDS on 11/08/2013 PASARR number received from EDS on 11/08/2013  FL2 transmitted to all facilities in geographic area requested by pt/family on  11/08/2013 FL2 transmitted to all facilities within larger geographic area on   Patient informed that his/her managed care company has contracts with or will negotiate with  certain facilities, including the following:     Patient/family informed of bed offers received:   Patient chooses bed at  Physician recommends and patient chooses bed at    Patient to be transferred to  on   Patient to be transferred to facility by   The following physician request were entered in Epic:   Additional Comments:    Alison Murray, MSW, [place] Work [phone]

## 2013-11-08 NOTE — Progress Notes (Signed)
Physical Therapy Treatment Patient Details Name: Salahuddin Arismendez MRN: [id number] DOB: [birth year] Today's Date: 11/08/2013    History of Present Illness 63 yo male admitted with hypotension, sepsis, L effusion/hydrothorax, ascites. hx of leukemia, cirrhosis, Hep B    PT Comments    **Pt motivated to progress with mobility. He asked for arm strengthening exercises. Issued theraband and instructed pt in use. Pt also performed BLE exercises. Pitting edema noted B ankles, encouraged ankle pumps. Pt walked 200' in hall with RW. He is progressing well, activity tolerance limited by fatigue/SOB.  *  Follow Up Recommendations  SNF;Supervision/Assistance - 24 hour (depending on progress)     Equipment Recommendations  None recommended by PT    Recommendations for Other Services OT consult     Precautions / Restrictions Precautions Precautions: Fall Restrictions Weight Bearing Restrictions: No    Mobility  Bed Mobility Overal bed mobility: Needs Assistance Bed Mobility: Supine to Sit     Supine to sit: Min assist     General bed mobility comments: min A for LEs OOB, HOB up 30*, used rail  Transfers Overall transfer level: Needs assistance Equipment used: Rolling walker (2 wheeled) Transfers: Sit to/from Stand Sit to Stand: +2 physical assistance;+2 safety/equipment;Min assist         General transfer comment: Assist to rise, stabilize, control descent. VCs safety, technique, hand placement  Ambulation/Gait Ambulation/Gait assistance: Min guard Ambulation Distance (Feet): 200 Feet Assistive device: Rolling walker (2 wheeled) Gait Pattern/deviations: WFL(Within Functional Limits)   Gait velocity interpretation: at or above normal speed for age/gender General Gait Details: steady with RW, distance limited by fatigue, 2/4 dyspnea with walking, HR 126 walking, SaO2 95% on RA walking   Stairs            Wheelchair Mobility    Modified Rankin (Stroke Patients  Only)       Balance                                    Cognition Arousal/Alertness: Awake/alert Behavior During Therapy: WFL for tasks assessed/performed Overall Cognitive Status: Within Functional Limits for tasks assessed                      Exercises General Exercises - Upper Extremity Shoulder Flexion: AROM;Both;5 reps;Seated;Theraband Elbow Flexion: AROM;Both;Theraband;5 reps Elbow Extension: AROM;Both;5 reps;Theraband General Exercises - Lower Extremity Ankle Circles/Pumps: AROM;Both;20 reps;Supine Long Arc Quad: AROM;Both;10 reps;Seated Hip Flexion/Marching: AROM;Both;5 reps;Seated    General Comments        Pertinent Vitals/Pain *0/10 pain HR 98 at rest, 126 with walking SaO2 95% on RA walking 2/4 dyspnea with walking**    Home Living                      Prior Function            PT Goals (current goals can now be found in the care plan section) Acute Rehab PT Goals Patient Stated Goal: 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, engineering, get strong enough for liver transplant, to strengthen arms and reduce B ankle swelling PT Goal Formulation: With patient Time For Goal Achievement: 11/20/13 Potential to Achieve Goals: Good Progress towards PT goals: Progressing toward goals    Frequency  Min 3X/week    PT Plan Current plan remains appropriate    Co-evaluation  End of Session   Activity Tolerance: Patient limited by fatigue Patient left: in chair;with call bell/phone within reach     Time: 0855-0923 PT Time Calculation (min): 28 min  Charges:  $Gait Training: 8-22 mins $Therapeutic Exercise: 8-22 mins                    G Codes:      Lucile Crater 11/08/2013, 9:29 AM [phone]

## 2013-11-08 NOTE — Progress Notes (Signed)
Patient transferred to 1509 in wheelchair. He was accompanied by the Nurse Tech and a visitor. All his belongings were with him including his cell phone and charger. BP 109/82, HR 90, RR 15, O2 96% on room air, patient was stable with no complaints of pain.

## 2013-11-08 NOTE — Progress Notes (Signed)

## 2013-11-08 NOTE — Progress Notes (Addendum)
Physical medicine and rehabilitation consult requested and chart reviewed. Initial evaluation by physical therapy 11/06/2013 shows patient progressing nicely and as of 11/08/2013 was ambulating 200 feet with minimal assistance gait pattern within functional limits. Do not feel this patient will need inpatient rehabilitation services at this time she continues to progress nicely. Recommendations are for discharge to home; if no assistance available may need skilled nursing facility. Will defer formal rehabilitation consult this time. Will discuss with case manager if needed.

## 2013-11-09 LAB — CBC
HEMATOCRIT: 34.6 % — AB (ref 39.0–52.0)
Hemoglobin: 11.8 g/dL — ABNORMAL LOW (ref 13.0–17.0)
MCH: 31.4 pg (ref 26.0–34.0)
MCHC: 34.1 g/dL (ref 30.0–36.0)
MCV: 92 fL (ref 78.0–100.0)
PLATELETS: 29 10*3/uL — AB (ref 150–400)
RBC: 3.76 MIL/uL — ABNORMAL LOW (ref 4.22–5.81)
RDW: 15.9 % — ABNORMAL HIGH (ref 11.5–15.5)
WBC: 3.6 10*3/uL — AB (ref 4.0–10.5)

## 2013-11-09 LAB — BASIC METABOLIC PANEL
BUN: 24 mg/dL — AB (ref 6–23)
CALCIUM: 7.6 mg/dL — AB (ref 8.4–10.5)
CO2: 24 mEq/L (ref 19–32)
CREATININE: 0.83 mg/dL (ref 0.50–1.35)
Chloride: 97 mEq/L (ref 96–112)
Glucose, Bld: 105 mg/dL — ABNORMAL HIGH (ref 70–99)
Potassium: 3.6 mEq/L — ABNORMAL LOW (ref 3.7–5.3)
Sodium: 134 mEq/L — ABNORMAL LOW (ref 137–147)

## 2013-11-09 LAB — PROCALCITONIN: Procalcitonin: 0.69 ng/mL

## 2013-11-09 LAB — MAGNESIUM: Magnesium: 1.8 mg/dL (ref 1.5–2.5)

## 2013-11-09 LAB — PHOSPHORUS: PHOSPHORUS: 2.6 mg/dL (ref 2.3–4.6)

## 2013-11-09 MED ORDER — FENTANYL CITRATE 0.05 MG/ML IJ SOLN
25.0000 ug | INTRAMUSCULAR | Status: DC | PRN
  Filled 2013-11-09: qty 2

## 2013-11-09 MED ORDER — SODIUM CHLORIDE 0.9 % IJ SOLN
10.0000 mL | INTRAMUSCULAR | Status: DC | PRN
  Administered 2013-11-09: 20 mL
  Administered 2013-11-12 – 2013-11-13 (×4): 10 mL

## 2013-11-09 MED ORDER — SODIUM CHLORIDE 0.9 % IJ SOLN
10.0000 mL | Freq: Two times a day (BID) | INTRAMUSCULAR | Status: DC
  Administered 2013-11-09 – 2013-11-11 (×5): 10 mL

## 2013-11-09 MED ORDER — ONDANSETRON HCL 4 MG/2ML IJ SOLN
4.0000 mg | Freq: Four times a day (QID) | INTRAMUSCULAR | Status: DC | PRN
  Filled 2013-11-09: qty 2

## 2013-11-09 NOTE — Progress Notes (Signed)
[facility] Progress Note  Subjective: Patient without any significant complaints, walking in the halls some, subjectively reporting good diuresis from his diuretics  Objective: Vital signs in last 24 hours: Temp:  [97.3 F (36.3 C)-98.4 F (36.9 C)] 98.4 F (36.9 C) (06/04 2052) Pulse Rate:  [87-126] 98 (06/04 2052) Resp:  [13-26] 16 (06/04 2052) BP: (94-109)/(66-82) 94/66 mmHg (06/04 2052) SpO2:  [81 %-97 %] 95 % (06/04 2052) Weight change:    PE: Essentially unchanged  Lab Results: Results for orders placed during the hospital encounter of 11/03/13 (from the past 24 hour(s))  PROCALCITONIN     Status: None   Collection Time    11/08/13  9:25 AM      Result Value Ref Range   Procalcitonin 0.85    LACTIC ACID, PLASMA     Status: Abnormal   Collection Time    11/08/13  9:25 AM      Result Value Ref Range   Lactic Acid, Venous 3.4 (*) 0.5 - 2.2 mmol/L  AMMONIA     Status: None   Collection Time    11/08/13  9:50 AM      Result Value Ref Range   Ammonia 17  11 - 60 umol/L  GLUCOSE, CAPILLARY     Status: Abnormal   Collection Time    11/08/13  1:04 PM      Result Value Ref Range   Glucose-Capillary 103 (*) 70 - 99 mg/dL  CBC     Status: Abnormal   Collection Time    11/09/13  5:25 AM      Result Value Ref Range   WBC 3.6 (*) 4.0 - 10.5 K/uL   RBC 3.76 (*) 4.22 - 5.81 MIL/uL   Hemoglobin 11.8 (*) 13.0 - 17.0 g/dL   HCT 34.6 (*) 39.0 - 52.0 %   MCV 92.0  78.0 - 100.0 fL   MCH 31.4  26.0 - 34.0 pg   MCHC 34.1  30.0 - 36.0 g/dL   RDW 15.9 (*) 11.5 - 15.5 %   Platelets 29 (*) 150 - 400 K/uL  BASIC METABOLIC PANEL     Status: Abnormal   Collection Time    11/09/13  5:25 AM      Result Value Ref Range   Sodium 134 (*) 137 - 147 mEq/L   Potassium 3.6 (*) 3.7 - 5.3 mEq/L   Chloride 97  96 - 112 mEq/L   CO2 24  19 - 32 mEq/L   Glucose, Bld 105 (*) 70 - 99 mg/dL   BUN 24 (*) 6 - 23 mg/dL   Creatinine, Ser 0.83  0.50 - 1.35 mg/dL   Calcium 7.6 (*) 8.4 -  10.5 mg/dL   GFR calc non Af Amer >90  >90 mL/min   GFR calc Af Amer >90  >90 mL/min  PHOSPHORUS     Status: None   Collection Time    11/09/13  5:25 AM      Result Value Ref Range   Phosphorus 2.6  2.3 - 4.6 mg/dL  MAGNESIUM     Status: None   Collection Time    11/09/13  5:25 AM      Result Value Ref Range   Magnesium 1.8  1.5 - 2.5 mg/dL  PROCALCITONIN     Status: None   Collection Time    11/09/13  5:25 AM      Result Value Ref Range   Procalcitonin 0.69      Studies/Results: US Paracentesis  11/07/2013   CLINICAL DATA:  Hepatitis, large volume ascites. Request therapeutic paracentesis of up to 4 L max.  EXAM: ULTRASOUND GUIDED PARACENTESIS  COMPARISON:  None.  PROCEDURE: An ultrasound guided paracentesis was thoroughly discussed with the patient and questions answered. The benefits, risks, alternatives and complications were also discussed. The patient understands and wishes to proceed with the procedure. Written consent was obtained.  Ultrasound was performed to localize and mark an adequate pocket of fluid in the right lower quadrant of the abdomen. The area was then prepped and draped in the normal sterile fashion. 1% Lidocaine was used for local anesthesia. Under ultrasound guidance a 19 gauge Yueh catheter was introduced. Paracentesis was performed. The catheter was removed and a dressing applied.  COMPLICATIONS: None immediate  FINDINGS: A total of approximately 4 L of clear yellow fluid was removed. A fluid sample was not sent for laboratory analysis.  IMPRESSION: Successful ultrasound guided paracentesis yielding 4 L of ascites.  Read by Ascencion Dike PA-C   Electronically Signed   By: Markus Daft M.D.   On: 11/07/2013 15:30   Dg Chest Port 1 View  11/08/2013   CLINICAL DATA:  Respiratory distress.  EXAM: PORTABLE CHEST - 1 VIEW  COMPARISON:  11/06/2013.  FINDINGS: The right IJ central line is unchanged. Bilateral pleural effusions are present which appear to layer subpulmonic.  Basilar atelectasis. Airspace disease and atelectasis in the left lung is unchanged. Compared to the prior exam 11/06/2013 at 0446 hr, there is little if any interval change.  IMPRESSION: No interval change.   Electronically Signed   By: Dereck Ligas M.D.   On: 11/08/2013 07:02      Assessment: Hepatitis B with end-stage cirrhosis  Klebsiella bacteremia, source unclear  CLL supposedly in remission Ascites, status post large volume paracentesis  Pancytopenia  Deconditioning   Plan: Continue ceftriaxone Continue spironolactone and Lasix, consider increasing Lasix to 40 mg a day currently limited by hypokalemia Large volume paracenteses prn Social services evaluating rehabilitation/discharge options Patient has scheduled a transplant evaluation at [facility] on June 19     Tyler Huang 11/09/2013, 8:58 AM

## 2013-11-09 NOTE — Progress Notes (Signed)
PULMONARY / CRITICAL CARE MEDICINE  Name: Tyler Huang MRN: [id number] DOB: May 12, 1951    ADMISSION DATE:  11/03/2013 CONSULTATION DATE:  11/03/2013  REFERRING MD :  EDP PRIMARY SERVICE:  PCCM  CHIEF COMPLAINT:  Hypotension  BRIEF PATIENT DESCRIPTION: 63 yo with Hep B cirrhosis ( no alcohol use ) on multiple diuretics preadmission, treated for CLL ( outside facility ) in 2014 ( now in remission ) presenting to [facility] ED on 5/30 with weakness, nausea, vomiting and hypotension since the night before admission. Hypotensive, requiring CVL placement and Levophed. Noted braun emesis, no overt hematemesis.  SIGNIFICANT EVENTS / STUDIES:  5/30  Abdomen / pelvis CT >>> Ascites, small gallstones, left pleural effusion and airspace disease 5/30  GI evaluation >>> significant GI hemorrhage unlikely, no indications for EGD 5/31  TTE >>>nl lv func. Ef 60%, small pericardial effusion. 6/1 diagnostic para>>wbc 136, neutrophil 32, no organisms seen  6/3 paracentesis 4 l per IR 6/5 CIR declined admission. SNF next option.   LINES / TUBES: R IJ CVL 5/30 >>> Foley 5/30 >>>  CULTURES: 5/30 Blood >>> GNR >> Klebsiella 5/30 Urine >>>neg 6/1 body fluid>>NGTD>>  ANTIBIOTICS: Zosyn 5/30 >>> 6/2 Vancomycin 5/30 >>> 6/1 Ceftriaxone 6/2 >>   INTERVAL HISTORY:  NAD, looks better  VITAL SIGNS: Temp:  [97.3 F (36.3 C)-98.4 F (36.9 C)] 97.5 F (36.4 C) (06/05 1243) Pulse Rate:  [92-108] 108 (06/05 1243) Resp:  [16-18] 18 (06/05 1243) BP: (94-113)/(66-82) 113/82 mmHg (06/05 1243) SpO2:  [95 %-96 %] 96 % (06/05 1243)  HEMODYNAMICS:    VENTILATOR SETTINGS:   INTAKE / OUTPUT: Intake/Output     06/04 0701 - 06/05 0700 06/05 0701 - 06/06 0700   P.O.  360   I.V. (mL/kg) 70 (0.9) 20 (0.3)   IV Piggyback 50    Total Intake(mL/kg) 120 (1.6) 380 (5)   Urine (mL/kg/hr)     Total Output       Net +120 +380        Urine Occurrence 3 x 1 x   Stool Occurrence 1 x 1 x    PHYSICAL  EXAMINATION: General:  Appears ill, no distress, off o2 Neuro:  Awake, alert, mae x 4 HEENT:  Temporal wasting, dry membranes Cardiovascular:  Regular, no murmurs Lungs:  Bilateral air entry, no added sounds. Diminished in bases. Abdomen:  Less distended, mild generalized tenderness,++ ascites to percussion, not tense.  Musculoskeletal:  Moves all extremities, + le  Edema and rt ue edema, decreased Skin:  Multiple ecchymoses , warm and dry  LABS: CBC  Recent Labs Lab 11/07/13 0400 11/08/13 0433 11/09/13 0525  WBC 3.6* 2.2* 3.6*  HGB 12.4* 10.8* 11.8*  HCT 36.2* 31.4* 34.6*  PLT 26* 26* 29*   Coag's  Recent Labs Lab 11/03/13 0916 11/06/13 0445 11/08/13 0433  INR 1.80* 1.83* 1.82*   BMET  Recent Labs Lab 11/07/13 0400 11/08/13 0433 11/09/13 0525  NA 130* 131* 134*  K 3.8 3.9 3.6*  CL 96 97 97  CO2 24 25 24   BUN 28* 27* 24*  CREATININE 0.94 0.84 0.83  GLUCOSE 124* 94 105*   Electrolytes  Recent Labs Lab 11/07/13 0400 11/08/13 0433 11/09/13 0525  CALCIUM 7.5* 7.4* 7.6*  MG 1.9 1.8 1.8  PHOS 2.7 2.1* 2.6   Sepsis Markers  Recent Labs Lab 11/03/13 1600 11/04/13 0015  11/05/13 0400 11/08/13 0925 11/09/13 0525  LATICACIDVEN 4.3* 3.4*  --   --  3.4*  --   PROCALCITON  --   --   < >  4.94 0.85 0.69  < > = values in this interval not displayed.  ABG No results found for this basename: PHART, PCO2ART, PO2ART,  in the last 168 hours Liver Enzymes  Recent Labs Lab 11/03/13 0916 11/04/13 0350 11/07/13 0400  AST 50* 57* 31  ALT 20 23 19   ALKPHOS 53 45 47  BILITOT 3.1* 2.8* 2.1*  ALBUMIN 1.6* 1.9* 2.0*   Cardiac Enzymes  Recent Labs Lab 11/03/13 1200 11/03/13 1600 11/04/13 0015  TROPONINI 1.10* 1.29* 1.42*   Glucose  Recent Labs Lab 11/07/13 1203 11/07/13 1546 11/07/13 1951 11/08/13 0529 11/08/13 0806 11/08/13 1304  GLUCAP 100* 96 127* 93 115* 103*   IMAGING:  US Paracentesis  11/07/2013   CLINICAL DATA:  Hepatitis, large  volume ascites. Request therapeutic paracentesis of up to 4 L max.  EXAM: ULTRASOUND GUIDED PARACENTESIS  COMPARISON:  None.  PROCEDURE: An ultrasound guided paracentesis was thoroughly discussed with the patient and questions answered. The benefits, risks, alternatives and complications were also discussed. The patient understands and wishes to proceed with the procedure. Written consent was obtained.  Ultrasound was performed to localize and mark an adequate pocket of fluid in the right lower quadrant of the abdomen. The area was then prepped and draped in the normal sterile fashion. 1% Lidocaine was used for local anesthesia. Under ultrasound guidance a 19 gauge Yueh catheter was introduced. Paracentesis was performed. The catheter was removed and a dressing applied.  COMPLICATIONS: None immediate  FINDINGS: A total of approximately 4 L of clear yellow fluid was removed. A fluid sample was not sent for laboratory analysis.  IMPRESSION: Successful ultrasound guided paracentesis yielding 4 L of ascites.  Read by Ascencion Dike PA-C   Electronically Signed   By: Markus Daft M.D.   On: 11/07/2013 15:30   Dg Chest Port 1 View  11/08/2013   CLINICAL DATA:  Respiratory distress.  EXAM: PORTABLE CHEST - 1 VIEW  COMPARISON:  11/06/2013.  FINDINGS: The right IJ central line is unchanged. Bilateral pleural effusions are present which appear to layer subpulmonic. Basilar atelectasis. Airspace disease and atelectasis in the left lung is unchanged. Compared to the prior exam 11/06/2013 at 0446 hr, there is little if any interval change.  IMPRESSION: No interval change.   Electronically Signed   By: Dereck Ligas M.D.   On: 11/08/2013 07:02   ASSESSMENT / PLAN:  PULMONARY A:   Lung hypoinflation from abdominal distension L effusion / hydrothorax with associated atelectasis, less likely pneumonia P:   Supplemental oxygen PRN(not needed) Pulm Hygiene    CARDIOVASCULAR  A:  Septic Shock; doubt significant  contribution hemorrhage Pericardial effusion small and non obstructive based on TTE.  P:  Goal MAP>60 Levophed gtt, weaned off, 6/2 aldactone re instituted,on  lasix Albumin given prior to large volume paracentesis 6/3.   RENAL Lab Results  Component Value Date   CREATININE 0.83 11/09/2013   CREATININE 0.84 11/08/2013   CREATININE 0.94 11/07/2013     A:   AKI due to ATN, septic shock, hypovolemia P:    Trend BMP Hold diuretics >restart 6/2(aldactone)6/4  Restart lasix  GASTROINTESTINAL A:   Hep B related cirrhosis / ascites Nausea / vomiting ( without overt hematemesis ), improved Doubt clinically significant GI hemorrhage Consider SBP as underlying cause sepsis Protein calorie malnutrition P:   GI input appreciated Diet Protonix bid Diagnostic para performed 6/1; results will affect type of SBP prophylaxis he will need in the future. 6/4 NGTD Large volume paracentesis 4  l  on 6/3 with albumin administration  HEMATOLOGIC Lab Results  Component Value Date   INR 1.82* 11/08/2013   INR 1.83* 11/06/2013   INR 1.80* 11/03/2013    A:   Thrombocytopenia >>> hypersplenism? sepsis? leukemia? bone marrow failure? Leucopenia >>> sepsis? leukemia? bone marrow failure? 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 Started ceftriaxone for klebsiella 6/1 Paracentesis > no overt evidence SBP  ENDOCRINE  CBG (last 3)   Recent Labs  11/08/13 0529 11/08/13 0806 11/08/13 1304  GLUCAP 93 115* 103*   A:   Hypoglycemia in setting of liver disease  Adrenal insufficiency ruled out ( Cortisol 117 )  P:   CBG q4h Change IVF to D5ns 6/1  NEUROLOGIC A:   No active isses P:   No intervention required   6/2 global: resume gentle diuresis  As bp tolerates, changed to SDU status. 6/3 4 l large volume paracentesis with albumin infusion IV, if  tolerates we can move to floor with goal of dc 1-2 days if stable. 6/4 consider tx to floor. Plts/wbc no improvement. 6/5 CIR declined admission. Seek SNF.  Richardson Landry Minor ACNP Maryanna Shape PCCM Pager [phone] till 3 pm If no answer page [phone] 11/09/2013, 12:54 PM  Baltazar Apo, MD, PhD 11/10/2013, 9:30 AM [facility] [phone] or if no answer [phone]

## 2013-11-09 NOTE — Progress Notes (Signed)
NUTRITION FOLLOW UP  Intervention:   - D/C Ensure Complete per pt preference - Encouraged continued excellent meal intake - Will order snacks in between meals - RD to continue to monitor   Nutrition Dx:   Inadequate oral intake related to poor appetite, ascites as evidenced by full liquid diet x 2 months - improving    Goal:   Pt to meet >/=90% of estimated nutrition needs - met   Monitor:   Weights, labs, intake  Assessment:   Patient is a 63 y.o M with Hep B cirrhosis (no alcohol use) and multiple diuretics preadmission, presenting to [facility] ED on 5/30 with weakness, nausea, vomiting and hypotension since the night before admission. PMHx significant for cancer, Hep B, cirrhosis.   6/1: -Pt states that he has had recent unintentional weight loss, but attributes this to his fluid loss with diuretics at home  - Pt states that his usual body weight is 150- 160 lbs without fluid  - Pt states that he has been on a mostly fluid diet for 2 months without any solid food. Prior to this, pt states that he ate a normal diet with 3 meals/ day  - Pt states that he drinks Ensure supplements at home  - Unable to determine is pt has had severe weight loss due to fluid accumulation and lack of prior history in chart   6/5: - Had 4 L removed via paracentesis 6/3 - Met with pt who reports not that great of an appetite due to changes in taste, however PO intake has been documented as 100% of breakfast this morning (ceeral and milk) - His sister has been brining him in flavors of Ensure he likes, states he doesn't like the taste of the Ensure the hospital has - Denies any nausea  - Agreeable to getting snacks in between meals  Potassium slightly low Total bilirubin elevated   Height: Ht Readings from Last 1 Encounters:  11/03/13 6' (1.829 m)    Weight Status:   Wt Readings from Last 1 Encounters:  11/08/13 166 lb 10.7 oz (75.6 kg)  Admit wt         147 lb (66.6 kg)  Re-estimated needs:   Kcal: 1900 - 2100  Protein: 115 - 130 grams  Fluid: >/= 1.9 L/day   Skin: +2 RLE, LLE edema, right wrist skin tear, lower back abrasion   Diet Order: General   Intake/Output Summary (Last 24 hours) at 11/09/13 1054 Last data filed at 11/09/13 1002  Gross per 24 hour  Intake    360 ml  Output      0 ml  Net    360 ml    Last BM: 6/4   Labs:   Recent Labs Lab 11/07/13 0400 11/08/13 0433 11/09/13 0525  NA 130* 131* 134*  K 3.8 3.9 3.6*  CL 96 97 97  CO2 24 25 24   BUN 28* 27* 24*  CREATININE 0.94 0.84 0.83  CALCIUM 7.5* 7.4* 7.6*  MG 1.9 1.8 1.8  PHOS 2.7 2.1* 2.6  GLUCOSE 124* 94 105*    CBG (last 3)   Recent Labs  11/08/13 0529 11/08/13 0806 11/08/13 1304  GLUCAP 93 115* 103*    Scheduled Meds: . cefTRIAXone (ROCEPHIN)  IV  1 g Intravenous Q24H  . feeding supplement (ENSURE COMPLETE)  237 mL Oral TID WC  . furosemide  20 mg Oral Daily  . pantoprazole  40 mg Oral BID  . sodium chloride  10-40 mL Intracatheter Q12H  .  spironolactone  50 mg Oral Daily    Continuous Infusions: . dextrose 5 % and 0.9% NaCl 10 mL/hr at 11/07/13 [place] MS, RD, LDN [phone] Pager [phone] Weekend/After Hours Pager

## 2013-11-09 NOTE — Progress Notes (Addendum)
Clinical Social Work  CSW received a call from sister reporting that she wanted to know about DC plans. CSW explained only SNF offer at this time is [facility] in [place]. Sister is worried about patient being DC too soon and wants to talk with MD about DC plans. CSW left a note for MD to call sister. Sister agreeable to placement at SNF and reports she will continue to visit and assist as needed. CSW will continue to follow and will assist with DC planning.  [place], [place] [phone]  Medicaid Information [facility] worker [phone] Reference # (retroactive) [id number] Reference # (ongoing) [id number]

## 2013-11-10 DIAGNOSIS — E43 Unspecified severe protein-calorie malnutrition: Secondary | ICD-10-CM

## 2013-11-10 LAB — BASIC METABOLIC PANEL
BUN: 23 mg/dL (ref 6–23)
CALCIUM: 7.8 mg/dL — AB (ref 8.4–10.5)
CO2: 23 meq/L (ref 19–32)
CREATININE: 0.79 mg/dL (ref 0.50–1.35)
Chloride: 98 mEq/L (ref 96–112)
GFR calc Af Amer: >90 mL/min (ref >90)
GFR calc non Af Amer: >90 mL/min (ref >90)
GLUCOSE: 114 mg/dL — AB (ref 70–99)
Potassium: 3.8 mEq/L (ref 3.7–5.3)
Sodium: 133 mEq/L — ABNORMAL LOW (ref 137–147)

## 2013-11-10 LAB — PROCALCITONIN: Procalcitonin: 0.44 ng/mL

## 2013-11-10 LAB — CBC
HCT: 37.2 % — ABNORMAL LOW (ref 39.0–52.0)
Hemoglobin: 12.9 g/dL — ABNORMAL LOW (ref 13.0–17.0)
MCH: 31.8 pg (ref 26.0–34.0)
MCHC: 34.7 g/dL (ref 30.0–36.0)
MCV: 91.6 fL (ref 78.0–100.0)
PLATELETS: 30 10*3/uL — AB (ref 150–400)
RBC: 4.06 MIL/uL — ABNORMAL LOW (ref 4.22–5.81)
RDW: 16.2 % — AB (ref 11.5–15.5)
WBC: 5.6 10*3/uL (ref 4.0–10.5)

## 2013-11-10 LAB — PROTIME-INR
INR: 1.68 — ABNORMAL HIGH (ref 0.00–1.49)
Prothrombin Time: 19.3 seconds — ABNORMAL HIGH (ref 11.6–15.2)

## 2013-11-10 NOTE — Progress Notes (Signed)
[facility] Progress Note  Subjective: The patient has no new complaints today. He states that the plan as far as he knows he is to move him to a rehabilitation center next week and hopefully he can make his liver transplant evaluation appointment at [facility] on June 19.  Objective: Vital signs in last 24 hours: Temp:  [97.6 F (36.4 C)-98.1 F (36.7 C)] 97.6 F (36.4 C) (06/06 0501) Pulse Rate:  [97-99] 99 (06/06 0501) Resp:  [16-18] 18 (06/06 0501) BP: (105-116)/(72-79) 116/79 mmHg (06/06 0501) SpO2:  [96 %] 96 % (06/05 2127) Weight:  [74.3 kg (163 lb 12.8 oz)-74.45 kg (164 lb 2.1 oz)] 74.45 kg (164 lb 2.1 oz) (06/06 0501) Weight change:    PE:  Cachectic-appearing  Abdomen with ascites  Lab Results: Results for orders placed during the hospital encounter of 11/03/13 (from the past 24 hour(s))  PROCALCITONIN     Status: None   Collection Time    11/10/13  5:30 AM      Result Value Ref Range   Procalcitonin 6.28    BASIC METABOLIC PANEL     Status: Abnormal   Collection Time    11/10/13  5:30 AM      Result Value Ref Range   Sodium 133 (*) 137 - 147 mEq/L   Potassium 3.8  3.7 - 5.3 mEq/L   Chloride 98  96 - 112 mEq/L   CO2 23  19 - 32 mEq/L   Glucose, Bld 114 (*) 70 - 99 mg/dL   BUN 23  6 - 23 mg/dL   Creatinine, Ser 0.79  0.50 - 1.35 mg/dL   Calcium 7.8 (*) 8.4 - 10.5 mg/dL   GFR calc non Af Amer >90  >90 mL/min   GFR calc Af Amer >90  >90 mL/min  CBC     Status: Abnormal   Collection Time    11/10/13  5:30 AM      Result Value Ref Range   WBC 5.6  4.0 - 10.5 K/uL   RBC 4.06 (*) 4.22 - 5.81 MIL/uL   Hemoglobin 12.9 (*) 13.0 - 17.0 g/dL   HCT 37.2 (*) 39.0 - 52.0 %   MCV 91.6  78.0 - 100.0 fL   MCH 31.8  26.0 - 34.0 pg   MCHC 34.7  30.0 - 36.0 g/dL   RDW 16.2 (*) 11.5 - 15.5 %   Platelets 30 (*) 150 - 400 K/uL  PROTIME-INR     Status: Abnormal   Collection Time    11/10/13  5:30 AM      Result Value Ref Range   Prothrombin Time 19.3 (*)  11.6 - 15.2 seconds   INR 1.68 (*) 0.00 - 1.49    Studies/Results: No results found.    Assessment: Hepatitis B cirrhosis  Plan: Continue current management.    Wonda Horner 11/10/2013, 1:36 PM

## 2013-11-10 NOTE — Progress Notes (Signed)
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 Hypotensive, requiring CVL placement and Levophed. Noted brown emesis, no overt hematemesis.  SIGNIFICANT EVENTS / STUDIES:  5/30  Abdomen / pelvis CT >>> Ascites, small gallstones, left pleural effusion and airspace disease 5/30  GI evaluation >>> significant GI hemorrhage unlikely, no indications for EGD 5/31  TTE >>>nl lv func. Ef 60%, small pericardial effusion. 6/1 diagnostic para>>wbc 136, neutrophil 32, no organisms seen  6/3 paracentesis 4 l per IR 6/5 CIR declined admission. SNF next option.   LINES / TUBES: R IJ CVL 5/30 >>> Foley 5/30 >>>  CULTURES: 5/30 Blood >>> GNR >> Klebsiella 5/30 Urine >>>neg 6/1 body fluid>>NGTD>>  ANTIBIOTICS: Zosyn 5/30 >>> 6/2 Vancomycin 5/30 >>> 6/1 Ceftriaxone 6/2 >>   INTERVAL HISTORY:  Denies acute discomfort or needs  VITAL SIGNS: Temp:  [97.5 F (36.4 C)-98.1 F (36.7 C)] 97.6 F (36.4 C) (06/06 0501) Pulse Rate:  [97-108] 99 (06/06 0501) Resp:  [16-18] 18 (06/06 0501) BP: (105-116)/(72-82) 116/79 mmHg (06/06 0501) SpO2:  [96 %] 96 % (06/05 2127) Weight:  [74.3 kg (163 lb 12.8 oz)-74.45 kg (164 lb 2.1 oz)] 74.45 kg (164 lb 2.1 oz) (06/06 0501)  HEMODYNAMICS:    VENTILATOR SETTINGS:   INTAKE / OUTPUT: Intake/Output     06/05 0701 - 06/06 0700 06/06 0701 - 06/07 0700   P.O. 480 240   I.V. (mL/kg) 20 (0.3)    IV Piggyback     Total Intake(mL/kg) 500 (6.7) 240 (3.2)   Net +500 +240        Urine Occurrence 3 x     Stool Occurrence 1 x     PHYSICAL EXAMINATION: General:  Appears ill, no distress, off o2 Neuro:  Awake, alert, mae x 4 HEENT:  Temporal wasting, dry membranes Cardiovascular:  Regular, no murmurs Lungs:  Bilateral air entry, no added sounds. Diminished in bases. Abdomen: distended, mild generalized tenderness,++++ ascites to percussion, firm.  Musculoskeletal:  Moves all extremities, + le  Edema and rt ue edema,  Skin:  Multiple ecchymoses , warm and dry  LABS: CBC  Recent Labs Lab 11/08/13 0433 11/09/13 0525 11/10/13 0530  WBC 2.2* 3.6* 5.6  HGB 10.8* 11.8* 12.9*  HCT 31.4* 34.6* 37.2*  PLT 26* 29* 30*   Coag's  Recent Labs Lab 11/06/13 0445 11/08/13 0433 11/10/13 0530  INR 1.83* 1.82* 1.68*   BMET  Recent Labs Lab 11/08/13 0433 11/09/13 0525 11/10/13 0530  NA 131* 134* 133*  K 3.9 3.6* 3.8  CL 97 97 98  CO2 25 24 23   BUN 27* 24* 23  CREATININE 0.84 0.83 0.79  GLUCOSE 94 105* 114*   Electrolytes  Recent Labs Lab 11/07/13 0400 11/08/13 0433 11/09/13 0525 11/10/13 0530  CALCIUM 7.5* 7.4* 7.6* 7.8*  MG 1.9 1.8 1.8  --   PHOS 2.7 2.1* 2.6  --    Sepsis Markers  Recent Labs Lab 11/03/13 1600 11/04/13 0015  11/08/13 0925 11/09/13 0525 11/10/13 0530  LATICACIDVEN 4.3* 3.4*  --  3.4*  --   --   PROCALCITON  --   --   < > 0.85 0.69 0.44  < > = values in this interval not displayed.  ABG No results found for this basename: PHART, PCO2ART, PO2ART,  in the last 168 hours Liver Enzymes  Recent Labs Lab 11/04/13 0350 11/07/13 0400  AST 57* 31  ALT 23 19  ALKPHOS 45 47  BILITOT 2.8* 2.1*  ALBUMIN 1.9* 2.0*   Cardiac Enzymes  Recent Labs Lab 11/03/13 1200 11/03/13 1600 11/04/13 0015  TROPONINI 1.10* 1.29* 1.42*   Glucose  Recent Labs Lab 11/07/13 1203 11/07/13 1546 11/07/13 1951 11/08/13 0529 11/08/13 0806 11/08/13 1304  GLUCAP 100* 96 127* 93 115* 103*   IMAGING:  No results found. ASSESSMENT /  PLAN:  PULMONARY A:   Lung hypoinflation from abdominal distension L effusion / hydrothorax with associated atelectasis, less likely pneumonia P:   Supplemental oxygen PRN(not needed) Pulm Hygiene    CARDIOVASCULAR  A:  Septic Shock; doubt significant contribution hemorrhage Pericardial effusion small and non obstructive based on TTE.  P:  Goal MAP>60 Levophed gtt, weaned off, 6/2 aldactone re instituted,on  lasix Albumin given prior to large volume paracentesis 6/3.   RENAL Lab Results  Component Value Date   CREATININE 0.79 11/10/2013   CREATININE 0.83 11/09/2013   CREATININE 0.84 11/08/2013     A:   AKI due to ATN, septic shock, hypovolemia P:    Trend BMP- stable Hold diuretics >restart 6/2(aldactone)6/4  Restart lasix  GASTROINTESTINAL A:   Hep B related cirrhosis / ascites Nausea / vomiting ( without overt hematemesis ), improved Doubt clinically significant GI hemorrhage Consider SBP as underlying cause sepsis Protein calorie malnutrition P:   GI input appreciated Diet Protonix bid Diagnostic para performed 6/1; results will affect type of SBP prophylaxis he will need in the future. 6/4 NGTD Large volume paracentesis 4 l  on 6/3 with albumin administration  HEMATOLOGIC Lab Results  Component Value Date   INR 1.68* 11/10/2013   INR 1.82* 11/08/2013   INR 1.83* 11/06/2013    A:   Thrombocytopenia >>> hypersplenism? sepsis? leukemia? bone marrow failure? Leucopenia >>> sepsis? leukemia? bone marrow failure? 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 Started ceftriaxone for klebsiella 6/1 Paracentesis > no overt evidence SBP  ENDOCRINE  CBG (last 3)   Recent Labs  11/08/13 0529 11/08/13 0806 11/08/13 1304  GLUCAP 93 115* 103*   A:   Hypoglycemia in setting of liver disease  Adrenal insufficiency ruled out  ( Cortisol 117 )  P:   CBG q4h Change IVF to D5ns 6/1  NEUROLOGIC A:   No active isses P:   No intervention required   6/2 global: resume gentle diuresis  As bp tolerates, changed to SDU status. 6/3 4 l large volume paracentesis with albumin infusion IV, if tolerates we can move to floor with goal of dc 1-2 days if stable. 6/4 tx to floor. Plts/wbc some improvement. 6/5 CIR declined admission. Seek SNF.   CD Young, MD  11/10/2013, 11:59 AM [facility] [phone] or if no answer [phone]

## 2013-11-11 NOTE — Progress Notes (Signed)
PULMONARY / CRITICAL CARE MEDICINE  Name: Tyler Huang MRN: [id number] DOB: Nov 19, 1950    ADMISSION DATE:  11/03/2013 CONSULTATION DATE:  11/03/2013  REFERRING MD :  EDP PRIMARY SERVICE:  PCCM  CHIEF COMPLAINT:  Hypotension  BRIEF PATIENT DESCRIPTION: 63 yo with Hep B cirrhosis ( no alcohol use ) on multiple diuretics preadmission, treated for CLL ( outside facility ) in 2014 ( now in remission ) presenting to [facility] ED on 5/30 with weakness, nausea, vomiting and hypotension since the night before admission. Hypotensive, requiring CVL placement and Levophed. Noted brown emesis, no overt hematemesis.  SIGNIFICANT EVENTS / STUDIES:  5/30  Abdomen / pelvis CT >>> Ascites, small gallstones, left pleural effusion and airspace disease 5/30  GI evaluation >>> significant GI hemorrhage unlikely, no indications for EGD 5/31  TTE >>>nl lv func. Ef 60%, small pericardial effusion. 6/1 diagnostic para>>wbc 136, neutrophil 32, no organisms seen  6/3 paracentesis 4 l per IR 6/5 CIR declined admission. SNF next option.   LINES / TUBES: R IJ CVL 5/30 >>> Foley 5/30 >>>  CULTURES: 5/30 Blood >>> GNR >> Klebsiella 5/30 Urine >>>neg 6/1 body fluid>>NGTD>>  ANTIBIOTICS: Zosyn 5/30 >>> 6/2 Vancomycin 5/30 >>> 6/1 Ceftriaxone 6/2 >>   INTERVAL HISTORY:  Denies acute discomfort or needs. Discussed his plan to go to sister's home in [place] where he will get outpatient rehab pending transplant eval visit to [place]: Temp:  [97.7 F (36.5 C)-98.1 F (36.7 C)] 97.7 F (36.5 C) (06/07 0503) Pulse Rate:  [84-100] 94 (06/07 0503) Resp:  [16-20] 18 (06/07 0503) BP: (104-107)/(72-75) 104/72 mmHg (06/07 0503) SpO2:  [95 %] 95 % (06/07 0503) Weight:  [77 kg (169 lb 12.1 oz)] 77 kg (169 lb 12.1 oz) (06/07 0503)  HEMODYNAMICS:    VENTILATOR SETTINGS:   INTAKE / OUTPUT: Intake/Output     06/06 0701 - 06/07 0700 06/07 0701 - 06/08 0700   P.O. 680 360   I.V. (mL/kg) 10 (0.1)  4720 (61.3)   Total Intake(mL/kg) 690 (9) 5080 (66)   Net +690 +5080        Urine Occurrence 4 x     PHYSICAL EXAMINATION: General:  Appears ill, no distress, off o2 Neuro:  Awake, alert, mae x 4 HEENT:  Temporal wasting, dry membranes Cardiovascular:  Regular, no murmurs Lungs:  Bilateral air entry, no added sounds. Diminished in bases. Abdomen: distended, mild generalized tenderness,++++ ascites to percussion, firm, not tight.  Musculoskeletal:  Moves all extremities, + le  Edema and rt ue edema,  Skin:  Multiple ecchymoses , warm and dry  LABS: CBC  Recent Labs Lab 11/08/13 0433 11/09/13 0525 11/10/13 0530  WBC 2.2* 3.6* 5.6  HGB 10.8* 11.8* 12.9*  HCT 31.4* 34.6* 37.2*  PLT 26* 29* 30*   Coag's  Recent Labs Lab 11/06/13 0445 11/08/13 0433 11/10/13 0530  INR 1.83* 1.82* 1.68*   BMET  Recent Labs Lab 11/08/13 0433 11/09/13 0525 11/10/13 0530  NA 131* 134* 133*  K 3.9 3.6* 3.8  CL 97 97 98  CO2 25 24 23   BUN 27* 24* 23  CREATININE 0.84 0.83 0.79  GLUCOSE 94 105* 114*   Electrolytes  Recent Labs Lab 11/07/13 0400 11/08/13 0433 11/09/13 0525 11/10/13 0530  CALCIUM 7.5* 7.4* 7.6* 7.8*  MG 1.9 1.8 1.8  --   PHOS 2.7 2.1* 2.6  --    Sepsis Markers  Recent Labs Lab 11/08/13 0925 11/09/13 0525 11/10/13 0530  LATICACIDVEN 3.4*  --   --  PROCALCITON 0.85 0.69 0.44    ABG No results found for this basename: PHART, PCO2ART, PO2ART,  in the last 168 hours Liver Enzymes  Recent Labs Lab 11/07/13 0400  AST 31  ALT 19  ALKPHOS 47  BILITOT 2.1*  ALBUMIN 2.0*   Cardiac Enzymes No results found for this basename: TROPONINI, PROBNP,  in the last 168 hours Glucose  Recent Labs Lab 11/07/13 1203 11/07/13 1546 11/07/13 1951 11/08/13 0529 11/08/13 0806 11/08/13 1304  GLUCAP 100* 96 127* 93 115* 103*   IMAGING:  No results found. ASSESSMENT / PLAN:  PULMONARY A:   Lung hypoinflation from abdominal distension L effusion /  hydrothorax with associated atelectasis, less likely pneumonia P:   Supplemental oxygen PRN(not needed) Pulm Hygiene    CARDIOVASCULAR  A:  Septic Shock; doubt significant contribution hemorrhage Pericardial effusion small and non obstructive based on TTE.  P:  Goal MAP>60 Levophed gtt, weaned off, 6/2 aldactone re instituted,on  lasix Albumin given prior to large volume paracentesis 6/3.   RENAL Lab Results  Component Value Date   CREATININE 0.79 11/10/2013   CREATININE 0.83 11/09/2013   CREATININE 0.84 11/08/2013     A:   AKI due to ATN, septic shock, hypovolemia P:    Trend BMP- stable Hold diuretics >restart 6/2(aldactone)6/4  Restart lasix  GASTROINTESTINAL A:   Hep B related cirrhosis / ascites Nausea / vomiting ( without overt hematemesis ), improved Doubt clinically significant GI hemorrhage Consider SBP as underlying cause sepsis Protein calorie malnutrition P:   GI input appreciated Diet Protonix bid Diagnostic para performed 6/1; results will affect type of SBP prophylaxis he will need in the future. 6/4 NGTD Large volume paracentesis 4 l  on 6/3 with albumin administration - Hi sister plans for him to get outpatient rehab at her home pending June 19 transplant eval visit to [facility]. This can be reviewed by social worker to see if we need to do anything to facilitate.  HEMATOLOGIC Lab Results  Component Value Date   INR 1.68* 11/10/2013   INR 1.82* 11/08/2013   INR 1.83* 11/06/2013    A:   Thrombocytopenia >>> hypersplenism? sepsis? leukemia? bone marrow failure? Leucopenia >>> sepsis? leukemia? bone marrow failure? 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 Started ceftriaxone for klebsiella 6/1 Paracentesis > no overt evidence SBP  ENDOCRINE  CBG (last 3)  No results found for this basename: GLUCAP,  in the last  72 hours A:   Hypoglycemia in setting of liver disease  Adrenal insufficiency ruled out ( Cortisol 117 )  P:   CBG q4h Change IVF to D5ns 6/1  NEUROLOGIC A:   No active isses P:   No intervention required   6/2 global: resume gentle diuresis  As bp tolerates, changed to SDU status. 6/3 4 l large volume paracentesis with albumin infusion IV, if tolerates we can move to floor with goal of dc 1-2 days if stable. 6/4 tx to floor. Plts/wbc some improvement. 6/5 CIR declined admission. Sister plans to take him home in [place] for outpatient rehab there.Bartholomew Crews, MD  11/11/2013, 1:05 PM [facility] [phone] or if no answer [phone]

## 2013-11-11 NOTE — Progress Notes (Signed)
[facility] Progress Note  Subjective: No complaints voice today  Objective: Vital signs in last 24 hours: Temp:  [97.7 F (36.5 C)-98.1 F (36.7 C)] 97.7 F (36.5 C) (06/07 0503) Pulse Rate:  [84-100] 94 (06/07 0503) Resp:  [16-20] 18 (06/07 0503) BP: (104-107)/(72-75) 104/72 mmHg (06/07 0503) SpO2:  [95 %] 95 % (06/07 0503) Weight:  [77 kg (169 lb 12.1 oz)] 77 kg (169 lb 12.1 oz) (06/07 0503) Weight change: 2.7 kg (5 lb 15.2 oz)   PE:  Cachectic-appearing  Abdomen distended with ascites but no different than yesterday  Lab Results: No results found for this or any previous visit (from the past 24 hour(s)).  Studies/Results: No results found.    Assessment: Hepatitis B cirrhosis  Plan: Continue Aldactone and Lasix. Paracentesis as needed. Patient will to be transferred soon, and keep his appointment with [facility] for evaluation of liver transplant.    Wonda Horner 11/11/2013, 10:21 AM

## 2013-11-12 ENCOUNTER — Inpatient Hospital Stay (HOSPITAL_COMMUNITY): Payer: Medicaid Other

## 2013-11-12 MED ORDER — ALBUMIN HUMAN 25 % IV SOLN
12.5000 g | Freq: Once | INTRAVENOUS | Status: AC
Start: 2013-11-12 — End: 2013-11-12
  Administered 2013-11-12: 12.5 g via INTRAVENOUS
  Filled 2013-11-12: qty 50

## 2013-11-12 MED ORDER — CIPROFLOXACIN HCL 500 MG PO TABS
500.0000 mg | ORAL_TABLET | Freq: Two times a day (BID) | ORAL | Status: DC
  Administered 2013-11-12 – 2013-11-14 (×5): 500 mg via ORAL
  Filled 2013-11-12 (×7): qty 1

## 2013-11-12 NOTE — Progress Notes (Signed)
Pharmacy Consult Note - Cipro PO  Labs/vitals: Scr stable, CrCl 102 ml/min. WBC stable WNL. Afebrile  A/P: per CCM, changing Rocephin to PO Cipro to complete 14 days of therapy total for klebsiella bacteremia. Patient completed 6 days of Rocephin so will continue Cipro 500mg  PO BID for 8 days to complete 14 days.   Adrian Saran, PharmD, BCPS Pager [phone] 11/12/2013 10:21 AM

## 2013-11-12 NOTE — Progress Notes (Signed)
Physical Therapy Treatment Patient Details Name: Tyler Huang MRN: [id number] DOB: [birth year] Today's Date: 11/12/2013    History of Present Illness 63 yo male admitted with hypotension, sepsis, L effusion/hydrothorax, ascites. hx of leukemia, cirrhosis, Hep B    PT Comments    Assisted pt OOB to amb in hallway twice with one sitting rest break.  Pt c/o MAX fatigue/weakness and has not amb in several days, so used EVW walker )B platform walker) for increased support and safety.    Follow Up Recommendations  SNF     Equipment Recommendations  None recommended by PT    Recommendations for Other Services       Precautions / Restrictions Precautions Precautions: Fall Precaution Comments: Parkinsons Restrictions Weight Bearing Restrictions: No    Mobility  Bed Mobility Overal bed mobility: Needs Assistance Bed Mobility: Supine to Sit;Sit to Supine     Supine to sit: Min assist Sit to supine: Min assist   General bed mobility comments: min A for LEs OOB, HOB up 30*, used rail  Transfers Overall transfer level: Needs assistance Equipment used: Rolling walker (2 wheeled) Transfers: Sit to/from Stand Sit to Stand: +2 physical assistance;+2 safety/equipment;Min assist         General transfer comment: Assist to rise, stabilize, control descent. VCs safety, technique, hand placement  Ambulation/Gait Ambulation/Gait assistance: +2 safety/equipment;Min assist Ambulation Distance (Feet): 300 Feet (150 x 2 one sitting rest break) Assistive device: Rolling walker (2 wheeled) Gait Pattern/deviations: Step-through pattern;Decreased stride length;Trunk flexed Gait velocity: decreased   General Gait Details: used EVW walker this session for safety as pt has not amb for several days and c/o MAX weakness/fatigue.  BP standing 91/63, HR 127 abs RA 93%.  NO c/o.   Stairs            Wheelchair Mobility    Modified Rankin (Stroke Patients Only)       Balance                                     Cognition                            Exercises      General Comments        Pertinent Vitals/Pain     Home Living                      Prior Function            PT Goals (current goals can now be found in the care plan section) Progress towards PT goals: Progressing toward goals    Frequency  Min 3X/week    PT Plan      Co-evaluation             End of Session Equipment Utilized During Treatment: Gait belt Activity Tolerance: Patient limited by fatigue Patient left: in bed;with call bell/phone within reach     Time: 1313-1338 PT Time Calculation (min): 25 min  Charges:  $Gait Training: 8-22 mins $Therapeutic Activity: 8-22 mins                    G Codes:      Rica Koyanagi  PTA [facility]  Acute  Rehab Pager      [phone]

## 2013-11-12 NOTE — Progress Notes (Signed)
PULMONARY / CRITICAL CARE MEDICINE  Name: Tyler Huang MRN: [id number] DOB: [birth year]    ADMISSION DATE:  11/03/2013  REFERRING MD :  EDP PRIMARY SERVICE:  PCCM  CHIEF COMPLAINT:  Hypotension  BRIEF PATIENT DESCRIPTION:  63 yo male presented with weakness, n/v, hypotension sepsis.  Hx of Hep C with cirrhosis, CLL >> in remission since 2014.  PCCM asked to admit to ICU.  SIGNIFICANT EVENTS: 5/30 Admit, GI consulted 6/05 not candidate for CIR  STUDIES:  5/30 Abdomen / pelvis CT >>> Ascites, small gallstones, left pleural effusion and airspace disease 5/30 GI evaluation >>> significant GI hemorrhage unlikely, no indications for EGD 5/31 TTE >>>nl lv func. Ef 60%, small pericardial effusion. 6/01 diagnostic para>>wbc 136, neutrophil 32, no organisms seen  6/03 paracentesis 4 l per IR  LINES / TUBES: R IJ CVL 5/30 >>> Foley 5/30 >>>  CULTURES: 5/30 Blood >>> Klebsiella 5/30 Urine >>>negative 6/1 body fluid>>>negative  ANTIBIOTICS: Zosyn 5/30 >>> 6/2 Vancomycin 5/30 >>> 6/1 Ceftriaxone 6/2 >> 6/08 Cipro 6/08 >>   INTERVAL HISTORY:  C/o abdominal distention.  This is causing decreased appetite, and more shortness of breath.  He denies chest pain.  He feels weak.  VITAL SIGNS: Temp:  [97.6 F (36.4 C)-98.3 F (36.8 C)] 98.1 F (36.7 C) (06/08 0552) Pulse Rate:  [88-104] 104 (06/08 0552) Resp:  [16-22] 22 (06/08 0552) BP: (104-118)/(69-79) 112/79 mmHg (06/08 0552) SpO2:  [93 %-97 %] 93 % (06/08 0552) Weight:  [171 lb 1.2 oz (77.6 kg)] 171 lb 1.2 oz (77.6 kg) (06/08 0552) INTAKE / OUTPUT: Intake/Output     06/07 0701 - 06/08 0700 06/08 0701 - 06/09 0700   P.O. 720    I.V. (mL/kg) 4882 (62.9)    Total Intake(mL/kg) 5602 (72.2)    Net +5602          Urine Occurrence 5 x 1 x   Stool Occurrence 1 x 1 x    PHYSICAL EXAMINATION: General: cachetic Neuro: alert, normal strength HEENT:  Temporal wasting Cardiovascular:  Regular, no murmurs Lungs: decreased bs at  bases Abdomen: distended, non tender Musculoskeletal: 1+ edema Skin:  Multiple ecchymoses  LABS: CBC  Recent Labs Lab 11/08/13 0433 11/09/13 0525 11/10/13 0530  WBC 2.2* 3.6* 5.6  HGB 10.8* 11.8* 12.9*  HCT 31.4* 34.6* 37.2*  PLT 26* 29* 30*   Coag's  Recent Labs Lab 11/06/13 0445 11/08/13 0433 11/10/13 0530  INR 1.83* 1.82* 1.68*   BMET  Recent Labs Lab 11/08/13 0433 11/09/13 0525 11/10/13 0530  NA 131* 134* 133*  K 3.9 3.6* 3.8  CL 97 97 98  CO2 25 24 23   BUN 27* 24* 23  CREATININE 0.84 0.83 0.79  GLUCOSE 94 105* 114*   Electrolytes  Recent Labs Lab 11/07/13 0400 11/08/13 0433 11/09/13 0525 11/10/13 0530  CALCIUM 7.5* 7.4* 7.6* 7.8*  MG 1.9 1.8 1.8  --   PHOS 2.7 2.1* 2.6  --    Sepsis Markers  Recent Labs Lab 11/08/13 0925 11/09/13 0525 11/10/13 0530  LATICACIDVEN 3.4*  --   --   PROCALCITON 0.85 0.69 0.44   Liver Enzymes  Recent Labs Lab 11/07/13 0400  AST 31  ALT 19  ALKPHOS 47  BILITOT 2.1*  ALBUMIN 2.0*   Glucose  Recent Labs Lab 11/07/13 1203 11/07/13 1546 11/07/13 1951 11/08/13 0529 11/08/13 0806 11/08/13 1304  GLUCAP 100* 96 127* 93 115* 103*   IMAGING:  No results found.  ASSESSMENT / PLAN:  Septic shock 2nd  to Klebsiella bacteremia >> shock resolved. Plan: Day 9 of Abx >> change to po cipro 6/08 to complete 14 day course of Abx  Hx of Hep C with cirrhosis, ascites. N/V >> improved. Protein calorie malnutrition. Plan: Continue lasix, aldactone Has transplant evaluation at [facility] for June 19 BID protonix Repeat therapeutic paracentesis 6/08  Pleural effusion in setting of cirrhosis with ascites. Plan: Monitor clinically  Thrombocytopenia >> ?from liver failure. Hx of CLL. Coagulopathy 2nd to liver disease. Plan: F/u CBC  Acute kidney injury 2nd to ATN, shock >> resolved. Hypoglycemia >> resolved.  Disposition >> Sister planning to have outpt rehab at home in [place].  He will  need to have arrangement for outpt PCP also.  Updated Sister at bedside.  Likely ready for d/c to nursing home on 6/09.  [name], MD [facility] Pulmonary/Critical Care 11/12/2013, 9:53 AM Pager:  [phone] After 3pm call: [phone]

## 2013-11-12 NOTE — Progress Notes (Addendum)
Clinical Social Work  CSW spoke with MD who reports patient should be ready to DC tomorrow. CSW met with patient at bedside who remains agreeable to DC to [facility] [place] called SNF and spoke with Neoma Laming who is agreeable to accept on LOG tomorrow. Patient will need to go on letter of guarantee. CSW will work with Surveyor, quantity for [employer] needs. CSW will continue to follow.  [place], [place] [phone]

## 2013-11-12 NOTE — Progress Notes (Signed)
Physical Therapy  Called to see pt this am before he has his paracentesis @ 2pm.  Arrived at room around 10:50am pt was in bed c/o max fatigue stating "I can't right now, can you come back later".  Pt agreed for me to return at [place]  PTA [facility]  Acute  Rehab Pager      [phone]

## 2013-11-12 NOTE — Procedures (Signed)
US guided therapeutic paracentesis performed yielding 10.2 liters yellow fluid. No immediate complications. IV albumin ordered by CCM.

## 2013-11-13 ENCOUNTER — Inpatient Hospital Stay (HOSPITAL_COMMUNITY): Payer: Medicaid Other

## 2013-11-13 LAB — COMPREHENSIVE METABOLIC PANEL
ALK PHOS: 70 U/L (ref 39–117)
ALK PHOS: 68 U/L (ref 39–117)
ALT: 16 U/L (ref 0–53)
ALT: 16 U/L (ref 0–53)
AST: 26 U/L (ref 0–37)
AST: 31 U/L (ref 0–37)
Albumin: 2 g/dL — ABNORMAL LOW (ref 3.5–5.2)
Albumin: 2.4 g/dL — ABNORMAL LOW (ref 3.5–5.2)
BILIRUBIN TOTAL: 2.9 mg/dL — AB (ref 0.3–1.2)
BUN: 23 mg/dL (ref 6–23)
BUN: 23 mg/dL (ref 6–23)
CALCIUM: 7.4 mg/dL — AB (ref 8.4–10.5)
CO2: 24 meq/L (ref 19–32)
CO2: 23 mEq/L (ref 19–32)
Calcium: 7.6 mg/dL — ABNORMAL LOW (ref 8.4–10.5)
Chloride: 96 mEq/L (ref 96–112)
Chloride: 97 mEq/L (ref 96–112)
Creatinine, Ser: 0.74 mg/dL (ref 0.50–1.35)
Creatinine, Ser: 0.75 mg/dL (ref 0.50–1.35)
GFR calc Af Amer: >90 mL/min (ref >90)
GFR calc non Af Amer: >90 mL/min (ref >90)
GLUCOSE: 116 mg/dL — AB (ref 70–99)
Glucose, Bld: 121 mg/dL — ABNORMAL HIGH (ref 70–99)
POTASSIUM: 3.5 meq/L — AB (ref 3.7–5.3)
Potassium: 3.9 mEq/L (ref 3.7–5.3)
SODIUM: 130 meq/L — AB (ref 137–147)
SODIUM: 132 meq/L — AB (ref 137–147)
Total Bilirubin: 2.4 mg/dL — ABNORMAL HIGH (ref 0.3–1.2)
Total Protein: 3.6 g/dL — ABNORMAL LOW (ref 6.0–8.3)
Total Protein: 3.7 g/dL — ABNORMAL LOW (ref 6.0–8.3)

## 2013-11-13 LAB — CBC
HCT: 34.7 % — ABNORMAL LOW (ref 39.0–52.0)
HCT: 35.8 % — ABNORMAL LOW (ref 39.0–52.0)
Hemoglobin: 12.3 g/dL — ABNORMAL LOW (ref 13.0–17.0)
Hemoglobin: 12.4 g/dL — ABNORMAL LOW (ref 13.0–17.0)
MCH: 32.5 pg (ref 26.0–34.0)
MCH: 31.6 pg (ref 26.0–34.0)
MCHC: 35.4 g/dL (ref 30.0–36.0)
MCHC: 34.6 g/dL (ref 30.0–36.0)
MCV: 91.6 fL (ref 78.0–100.0)
MCV: 91.1 fL (ref 78.0–100.0)
Platelets: 49 10*3/uL — ABNORMAL LOW (ref 150–400)
Platelets: 55 10*3/uL — ABNORMAL LOW (ref 150–400)
RBC: 3.79 MIL/uL — ABNORMAL LOW (ref 4.22–5.81)
RBC: 3.93 MIL/uL — AB (ref 4.22–5.81)
RDW: 16.2 % — ABNORMAL HIGH (ref 11.5–15.5)
RDW: 16.3 % — ABNORMAL HIGH (ref 11.5–15.5)
WBC: 4.1 10*3/uL (ref 4.0–10.5)
WBC: 4.4 10*3/uL (ref 4.0–10.5)

## 2013-11-13 LAB — MAGNESIUM: Magnesium: 1.6 mg/dL (ref 1.5–2.5)

## 2013-11-13 LAB — GLUCOSE, CAPILLARY: Glucose-Capillary: 81 mg/dL (ref 70–99)

## 2013-11-13 MED ORDER — ALBUMIN HUMAN 25 % IV SOLN
25.0000 g | Freq: Once | INTRAVENOUS | Status: AC
  Administered 2013-11-13: 25 g via INTRAVENOUS
  Filled 2013-11-13: qty 50

## 2013-11-13 MED ORDER — ALBUMIN HUMAN 25 % IV SOLN
INTRAVENOUS | Status: AC
  Administered 2013-11-13: 25 g via INTRAVENOUS
  Filled 2013-11-13: qty 50

## 2013-11-13 MED ORDER — SODIUM CHLORIDE 0.9 % IJ SOLN
3.0000 mL | Freq: Two times a day (BID) | INTRAMUSCULAR | Status: DC
  Administered 2013-11-13 – 2013-11-14 (×3): 3 mL via INTRAVENOUS

## 2013-11-13 MED ORDER — CIPROFLOXACIN HCL 500 MG PO TABS: 500.0000 mg | ORAL_TABLET | Freq: Two times a day (BID) | ORAL | Status: DC

## 2013-11-13 MED ORDER — TORSEMIDE 20 MG PO TABS: 20.0000 mg | ORAL_TABLET | Freq: Two times a day (BID) | ORAL | Status: DC

## 2013-11-13 MED ORDER — PANTOPRAZOLE SODIUM 40 MG PO TBEC: 40.0000 mg | DELAYED_RELEASE_TABLET | Freq: Two times a day (BID) | ORAL | Status: DC

## 2013-11-13 NOTE — Discharge Summary (Signed)
Physician Discharge Summary  Patient ID: Tyler Huang MRN: [id number] DOB/AGE: 1951/03/21 63 y.o.  Admit date: 11/03/2013 Discharge date: 11/13/2013  Problem List Active Problems:   Septic shock(785.52)   GI hemorrhage   Coagulopathy   Pancytopenia   Bacteremia due to Gram-negative bacteria   Protein-calorie malnutrition, severe  HPI: 63 yo with Hep B cirrhosis ( no alcohol use ) on multiple diuretics preadmission, treated for CLL ( outside facility ) in 2014 ( now in remission ) presenting to [facility] ED on 5/30 with weakness, nausea, vomiting and hypotension since the night before admission. Hypotensive, requiring CVL placement and Levophed. Noted braun emesis, no overt hematemesis. At home multiple episodes of non bloody non bilious emesis. Denies significant abdominal pain, but reports some abdominal distension. Denies respiratory symptoms  Hospital Course: SIGNIFICANT EVENTS:  5/30 Admit, GI consulted  6/05 not candidate for CIR  STUDIES:  5/30 Abdomen / pelvis CT >>> Ascites, small gallstones, left pleural effusion and airspace disease  5/30 GI evaluation >>> significant GI hemorrhage unlikely, no indications for EGD  5/31 TTE >>>nl lv func. Ef 60%, small pericardial effusion.  6/01 diagnostic para>>wbc 136, neutrophil 32, no organisms seen  6/03 paracentesis 4 l per IR  6/08 paracentesis 10.2 liters per IR  LINES / TUBES:  R IJ CVL 5/30 >>> 6/9 Foley 5/30 >>> 6/9 CULTURES:  5/30 Blood >>> Klebsiella  5/30 Urine >>>negative  6/1 body fluid>>>negative  ANTIBIOTICS:  Zosyn 5/30 >>> 6/2  Vancomycin 5/30 >>> 6/1  Ceftriaxone 6/2 >> 6/08  Cipro 6/08 >> 6/14 planned       Labs at discharge Lab Results  Component Value Date   CREATININE 0.74 11/13/2013   BUN 23 11/13/2013   NA 130* 11/13/2013   K 3.5* 11/13/2013   CL 96 11/13/2013   CO2 24 11/13/2013   Lab Results  Component Value Date   WBC 4.1 11/13/2013   HGB 12.3* 11/13/2013   HCT 34.7* 11/13/2013   MCV 91.6 11/13/2013   PLT  49* 11/13/2013   Lab Results  Component Value Date   ALT 16 11/13/2013   AST 26 11/13/2013   ALKPHOS 70 11/13/2013   BILITOT 2.4* 11/13/2013   Lab Results  Component Value Date   INR 1.68* 11/10/2013   INR 1.82* 11/08/2013   INR 1.83* 11/06/2013    Current radiology studies US Paracentesis  11/12/2013   CLINICAL DATA:  Patient with history of hepatitis-C, cirrhosis, CLL in remission, recurrent ascites. Request is made for therapeutic large volume paracentesis.  EXAM: ULTRASOUND GUIDED THERAPEUTIC PARACENTESIS  COMPARISON:  PRIOR PARACENTESIS ON 11/07/2013  PROCEDURE: An ultrasound guided paracentesis was thoroughly discussed with the patient and questions answered. The benefits, risks, alternatives and complications were also discussed. The patient understands and wishes to proceed with the procedure. Written consent was obtained.  Ultrasound was performed to localize and mark an adequate pocket of fluid in the left lower quadrant of the abdomen. The area was then prepped and draped in the normal sterile fashion. 1% Lidocaine was used for local anesthesia. Under ultrasound guidance a 19 gauge Yueh catheter was introduced. Paracentesis was performed. The catheter was removed and a dressing applied.  Complications: None.  FINDINGS: A total of approximately 10.2 liters of yellow fluid was removed.  IMPRESSION: Successful ultrasound guided therapeutic paracentesis yielding 10.2 liters of ascites.  Read by: Tyler Huang ,P.A.-C.   Electronically Signed   By: Tyler Huang M.D.   On: 11/12/2013 17:16    Disposition:  Final  discharge disposition not confirmed  Discharge Instructions   Discharge to SNF when bed available    Complete by:  As directed             Medication List    STOP taking these medications       predniSONE 10 MG tablet  Commonly known as:  DELTASONE      TAKE these medications       ciprofloxacin 500 MG tablet  Commonly known as:  CIPRO  Take 1 tablet (500 mg total) by mouth  2 (two) times daily.     pantoprazole 40 MG tablet  Commonly known as:  PROTONIX  Take 1 tablet (40 mg total) by mouth 2 (two) times daily.     spironolactone 50 MG tablet  Commonly known as:  ALDACTONE  Take 50 mg by mouth daily.     torsemide 20 MG tablet  Commonly known as:  DEMADEX  Take 1 tablet (20 mg total) by mouth 2 (two) times daily.          Discharged Condition: fair  Time spent on discharge greater than 40 minutes.  Vital signs at Discharge. Temp:  [97.6 F (36.4 C)-98.1 F (36.7 C)] 97.9 F (36.6 C) (06/09 0529) Pulse Rate:  [96-103] 96 (06/09 0529) Resp:  [16-24] 16 (06/09 0529) BP: (94-108)/(64-75) 96/64 mmHg (06/09 0529) SpO2:  [96 %-97 %] 97 % (06/09 0529) Weight:  [69.5 kg (153 lb 3.5 oz)] 69.5 kg (153 lb 3.5 oz) (06/09 0529) Office follow up Special Information or instructions. He will follow up at [facility] team.  Signed: Richardson Landry Huang ACNP Tyler Huang PCCM Pager [phone] till 3 pm If no answer page [phone] 11/13/2013, 11:22 AM     Tyler Mires, MD [place] 11/13/2013, 11:42 AM Pager:  [phone] After 3pm call: [phone]

## 2013-11-13 NOTE — Progress Notes (Signed)
Rounding on patient, discovered patient dyspneic with shallow/labored breathing with respirations in the 30's.  Nail beds cyanotic and face color ashen.  O2 sat 64% room air.  O2 5L [place] applied. O2 sats increased to 93%, BP 85/55, HR 105.  Pt states breathing improved and cyanosis resolved. Tammy RN notified.  Provider notified.

## 2013-11-13 NOTE — Progress Notes (Addendum)
approx 1505, port CXR in room with pt, after xray completed xray staff called out for help that pt was not responding. Upon entering room pt was not breathing, code called, pt's name called out and pt stimulated to respond, pt spontaneously started rebreathing, shallow at first. After several minutes pt verbally responded, "I'm okay" to one of the nurses Simona Huh, Therapist, sports).  Richardson Landry Minor, NP called again to inform him of current pt status. On his way to see pt.  (Code Blue team and rapid response RN at pt's bedside.).

## 2013-11-13 NOTE — Significant Event (Signed)
Called to bedside for apnea with out cardiac arrest. When cvl removed at 1435 he tolerated well. 1445 he was noted to sob and was ashen and fingers blue with sats of 64 and palced ion O2 at 4 with sats 86%. BP 85/49. Code was called at 1500 for apnea and by the time I arrived he had resumed spontaneous respirations. We will transfer back to ICU. BP 97/63  Pulse 109  Temp(Src) 98 F (36.7 C) (Oral)  Resp 24  Ht 6' (1.829 m)  Wt 69.5 kg (153 lb 3.5 oz)  BMI 20.78 kg/m2  SpO2 95%   Recent Labs Lab 11/09/13 0525 11/10/13 0530 11/13/13 0420  NA 134* 133* 130*  K 3.6* 3.8 3.5*  CL 97 98 96  CO2 24 23 24   BUN 24* 23 23  CREATININE 0.83 0.79 0.74  GLUCOSE 105* 114* 116*    Recent Labs Lab 11/09/13 0525 11/10/13 0530 11/13/13 0420  HGB 11.8* 12.9* 12.3*  HCT 34.6* 37.2* 34.7*  WBC 3.6* 5.6 4.1  PLT 29* 30* 49*    US Paracentesis  11/12/2013   CLINICAL DATA:  Patient with history of hepatitis-C, cirrhosis, CLL in remission, recurrent ascites. Request is made for therapeutic large volume paracentesis.  EXAM: ULTRASOUND GUIDED THERAPEUTIC PARACENTESIS  COMPARISON:  PRIOR PARACENTESIS ON 11/07/2013  PROCEDURE: An ultrasound guided paracentesis was thoroughly discussed with the patient and questions answered. The benefits, risks, alternatives and complications were also discussed. The patient understands and wishes to proceed with the procedure. Written consent was obtained.  Ultrasound was performed to localize and mark an adequate pocket of fluid in the left lower quadrant of the abdomen. The area was then prepped and draped in the normal sterile fashion. 1% Lidocaine was used for local anesthesia. Under ultrasound guidance a 19 gauge Yueh catheter was introduced. Paracentesis was performed. The catheter was removed and a dressing applied.  Complications: None.  FINDINGS: A total of approximately 10.2 liters of yellow fluid was removed.  IMPRESSION: Successful ultrasound guided therapeutic  paracentesis yielding 10.2 liters of ascites.  Read by: Rowe Robert ,P.A.-C.   Electronically Signed   By: Jacqulynn Cadet M.D.   On: 11/12/2013 17:16   Dg Chest Port 1 View  11/13/2013   CLINICAL DATA:  Short of breath. desaturations following removal of central line.  EXAM: PORTABLE CHEST - 1 VIEW  COMPARISON:  11/08/2013.  FINDINGS: The right IJ central line has been removed. Left pleural effusion and left basilar atelectasis remains present. Colonic gaseous distension. Cardiopericardial silhouette appears within normal limits. Aortic arch atherosclerosis.  There is no pneumothorax.  Oxygen tubing projects over the chest.  IMPRESSION: Interval removal of right IJ central line. Low volume chest with left pleural effusion and left basilar atelectasis. No pneumothorax.   Electronically Signed   By: Dereck Ligas M.D.   On: 11/13/2013 15:17     I/P apnea of unknown origin in  severely ill  Individual with ES liver disease and with paracentesis of 10 litres performed  6/9  A. Move back to ICU B. Albumin x 1 C. Check CxR  Richardson Landry Minor ACNP Maryanna Shape PCCM Pager [phone] till 3 pm If no answer page [phone] 11/13/2013, 3:26 PM  It is unclear to me what precipitated these events after he had central line removed.  His blood pressure is low >> will give albumin.  He is maintaining his oxygenation on 6 liters oxygen and CXR did not show any new findings.  ECG is unrevealing for  acute findings.  Will check labs.  Will monitor in ICU.    Updated pt's sister about events.  Discussed disposition options >> will need to cancel transfer to NH.  Better option might be to arrange for pt transfer directly to [facility] >> he could then have transplant evaluation done there as an inpt.  CC time 40 minutes.  [name], MD [facility] Pulmonary/Critical Care 11/13/2013, 3:56 PM Pager:  [phone] After 3pm call: [phone]

## 2013-11-13 NOTE — Progress Notes (Addendum)
Central line just pulled approx 20min prior to RN AD, Chasity entering pt's room to do pt rounding. Pt was SOB, sats 64% etc, see note made by Chasity of her assessment at time she entered room.  At this time 1450, Richardson Landry Minor, NP notified of pt's status post central d/c'd.   Pt was to be d/c'd to SNF,  Discharge has been canceled at this time, PCXR ordered, IV being placed by IV team.

## 2013-11-13 NOTE — Progress Notes (Signed)
Clinical Social Work  Patient was to DC to SNF and CSW had faxed DC summary to SNF. CSW spoke with RN who reports DC has been canceled due to medical needs. CSW updated [place] plans on [employer]. CSW alerted Surveyor, quantity for delay on LOG until patient is medically stable. CSW left a message with sister re: canceled DC and encouraged her to contact bedside RN for further information. CSW will continue to follow.  [place], [place] [phone]

## 2013-11-13 NOTE — Progress Notes (Signed)
Was paged by staff about 10 minutes after I removed his right IJ central line.   Informed me he was short of breath, labored breathing with O2 saturations in 60's.  I immediately went back to his room.   Drsg D/I.   No bleeding noted down behind him.  Staff was monitoring his VS.  Pt denied any pain in neck where line was.

## 2013-11-13 NOTE — Progress Notes (Signed)
PULMONARY / CRITICAL CARE MEDICINE  Name: Tyler Huang MRN: [id number] DOB: Oct 16, 1950    ADMISSION DATE:  11/03/2013  REFERRING MD :  EDP PRIMARY SERVICE:  PCCM  CHIEF COMPLAINT:  Hypotension  BRIEF PATIENT DESCRIPTION:  63 yo male presented with weakness, n/v, hypotension sepsis.  Hx of Hep C with cirrhosis, CLL >> in remission since 2014.  PCCM asked to admit to ICU.  SIGNIFICANT EVENTS: 5/30 Admit, GI consulted 6/05 not candidate for CIR  STUDIES:  5/30 Abdomen / pelvis CT >>> Ascites, small gallstones, left pleural effusion and airspace disease 5/30 GI evaluation >>> significant GI hemorrhage unlikely, no indications for EGD 5/31 TTE >>>nl lv func. Ef 60%, small pericardial effusion. 6/01 diagnostic para>>wbc 136, neutrophil 32, no organisms seen  6/03 paracentesis 4 l per IR 6/08 paracentesis 10.2 liters per IR  LINES / TUBES: R IJ CVL 5/30 >>> Foley 5/30 >>>  CULTURES: 5/30 Blood >>> Klebsiella 5/30 Urine >>>negative 6/1 body fluid>>>negative  ANTIBIOTICS: Zosyn 5/30 >>> 6/2 Vancomycin 5/30 >>> 6/1 Ceftriaxone 6/2 >> 6/08 Cipro 6/08 >>   INTERVAL HISTORY:    VITAL SIGNS: Temp:  [97.6 F (36.4 C)-98.1 F (36.7 C)] 97.9 F (36.6 C) (06/09 0529) Pulse Rate:  [96-103] 96 (06/09 0529) Resp:  [16-24] 16 (06/09 0529) BP: (94-108)/(64-75) 96/64 mmHg (06/09 0529) SpO2:  [96 %-97 %] 97 % (06/09 0529) Weight:  [153 lb 3.5 oz (69.5 kg)] 153 lb 3.5 oz (69.5 kg) (06/09 0529) INTAKE / OUTPUT: Intake/Output     06/08 0701 - 06/09 0700 06/09 0701 - 06/10 0700   P.O.     I.V. (mL/kg) 190 (2.7)    Total Intake(mL/kg) 190 (2.7)    Net +190          Urine Occurrence 3 x    Stool Occurrence 2 x     PHYSICAL EXAMINATION: General: cachetic Neuro: alert, normal strength HEENT:  Temporal wasting Cardiovascular:  Regular, no murmurs Lungs: decreased bs at bases Abdomen: soft, non tender Musculoskeletal: 1+ edema Skin:  Multiple ecchymoses  LABS: CBC  Recent  Labs Lab 11/09/13 0525 11/10/13 0530 11/13/13 0420  WBC 3.6* 5.6 4.1  HGB 11.8* 12.9* 12.3*  HCT 34.6* 37.2* 34.7*  PLT 29* 30* 49*   Coag's  Recent Labs Lab 11/08/13 0433 11/10/13 0530  INR 1.82* 1.68*   BMET  Recent Labs Lab 11/09/13 0525 11/10/13 0530 11/13/13 0420  NA 134* 133* 130*  K 3.6* 3.8 3.5*  CL 97 98 96  CO2 24 23 24   BUN 24* 23 23  CREATININE 0.83 0.79 0.74  GLUCOSE 105* 114* 116*   Electrolytes  Recent Labs Lab 11/07/13 0400 11/08/13 0433 11/09/13 0525 11/10/13 0530 11/13/13 0420  CALCIUM 7.5* 7.4* 7.6* 7.8* 7.4*  MG 1.9 1.8 1.8  --   --   PHOS 2.7 2.1* 2.6  --   --    Sepsis Markers  Recent Labs Lab 11/08/13 0925 11/09/13 0525 11/10/13 0530  LATICACIDVEN 3.4*  --   --   PROCALCITON 0.85 0.69 0.44   Liver Enzymes  Recent Labs Lab 11/07/13 0400 11/13/13 0420  AST 31 26  ALT 19 16  ALKPHOS 47 70  BILITOT 2.1* 2.4*  ALBUMIN 2.0* 2.0*   Glucose  Recent Labs Lab 11/07/13 1203 11/07/13 1546 11/07/13 1951 11/08/13 0529 11/08/13 0806 11/08/13 1304  GLUCAP 100* 96 127* 93 115* 103*   IMAGING:  US Paracentesis  11/12/2013   CLINICAL DATA:  Patient with history of hepatitis-C, cirrhosis, CLL  in remission, recurrent ascites. Request is made for therapeutic large volume paracentesis.  EXAM: ULTRASOUND GUIDED THERAPEUTIC PARACENTESIS  COMPARISON:  PRIOR PARACENTESIS ON 11/07/2013  PROCEDURE: An ultrasound guided paracentesis was thoroughly discussed with the patient and questions answered. The benefits, risks, alternatives and complications were also discussed. The patient understands and wishes to proceed with the procedure. Written consent was obtained.  Ultrasound was performed to localize and mark an adequate pocket of fluid in the left lower quadrant of the abdomen. The area was then prepped and draped in the normal sterile fashion. 1% Lidocaine was used for local anesthesia. Under ultrasound guidance a 19 gauge Yueh catheter was  introduced. Paracentesis was performed. The catheter was removed and a dressing applied.  Complications: None.  FINDINGS: A total of approximately 10.2 liters of yellow fluid was removed.  IMPRESSION: Successful ultrasound guided therapeutic paracentesis yielding 10.2 liters of ascites.  Read by: Rowe Robert ,P.A.-C.   Electronically Signed   By: Jacqulynn Cadet M.D.   On: 11/12/2013 17:16    ASSESSMENT / PLAN:  Septic shock 2nd to Klebsiella bacteremia >> shock resolved. Plan: Day 10/14 of Abx, currently on cipro  Hx of Hep C with cirrhosis, ascites. N/V >> improved. Protein calorie malnutrition. Plan: Continue lasix, aldactone Has transplant evaluation at [facility] for June 19 BID protonix  Pleural effusion in setting of cirrhosis with ascites. Plan: Monitor clinically  Thrombocytopenia >> ?from liver failure >> improving. Hx of CLL. Coagulopathy 2nd to liver disease. Plan: F/u CBC  Acute kidney injury 2nd to ATN, shock >> resolved. Hypoglycemia >> resolved.  Disposition >> He is ready for d/c to NH in [place] today.  Updated Sister at bedside.  [name], MD [facility] Pulmonary/Critical Care 11/13/2013, 11:02 AM Pager:  [phone] After 3pm call: [phone]

## 2013-11-13 NOTE — Progress Notes (Addendum)
At 1525 pt was transferred to ICU unit with RR RN, report was given to [employer].

## 2013-11-14 ENCOUNTER — Inpatient Hospital Stay (HOSPITAL_COMMUNITY): Payer: Medicaid Other

## 2013-11-14 LAB — CBC
HCT: 34.3 % — ABNORMAL LOW (ref 39.0–52.0)
Hemoglobin: 12 g/dL — ABNORMAL LOW (ref 13.0–17.0)
MCH: 31.8 pg (ref 26.0–34.0)
MCHC: 35 g/dL (ref 30.0–36.0)
MCV: 91 fL (ref 78.0–100.0)
Platelets: 52 10*3/uL — ABNORMAL LOW (ref 150–400)
RBC: 3.77 MIL/uL — ABNORMAL LOW (ref 4.22–5.81)
RDW: 16.3 % — AB (ref 11.5–15.5)
WBC: 3.6 10*3/uL — ABNORMAL LOW (ref 4.0–10.5)

## 2013-11-14 LAB — BASIC METABOLIC PANEL
BUN: 23 mg/dL (ref 6–23)
CO2: 23 meq/L (ref 19–32)
CREATININE: 0.71 mg/dL (ref 0.50–1.35)
Calcium: 7.5 mg/dL — ABNORMAL LOW (ref 8.4–10.5)
Chloride: 98 mEq/L (ref 96–112)
GFR calc Af Amer: >90 mL/min (ref >90)
GFR calc non Af Amer: >90 mL/min (ref >90)
Glucose, Bld: 92 mg/dL (ref 70–99)
Potassium: 3.9 mEq/L (ref 3.7–5.3)
Sodium: 131 mEq/L — ABNORMAL LOW (ref 137–147)

## 2013-11-14 NOTE — Progress Notes (Signed)
CSW reviewed chart and noted that pt has been accepted for transfer to medical bed at [facility] by Dr. Young Berry and will transfer to [facility] today.  CSW notified [facility] that pt being transferred to [facility] for continued care.   No further social work needs identified at this time.  CSW signing off.   Alison Murray, MSW, [employer] [phone]

## 2013-11-14 NOTE — Progress Notes (Addendum)
Report called to [facility] in MICU at [facility] by Manuela Neptune RN. Daughter Jan notified of patient transfer. Care link to transport patient to facility already in route to [facility]. Paper work and documentation ready for pickup. CCMD and Elink notified of transfer

## 2013-11-14 NOTE — Discharge Summary (Signed)
Physician Discharge Summary  Patient ID: Tyler Huang MRN: [id number] DOB/AGE: Apr 26, 1951 63 y.o.  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 Hypotensive, requiring CVL placement and Levophed. Noted braun emesis, no overt hematemesis. At home multiple episodes of non bloody non bilious emesis. Denies significant abdominal pain, but reports some abdominal distension. Denies respiratory symptoms.  Hospital Course: SIGNIFICANT EVENTS:  5/30 Admit, GI consulted  6/05 not candidate for CIR  6/09 when cvl dc'd he has resp distress, apnea and code was called for apnea. Transferred back to ICU.  6/10 attempt to transfer to [facility] as he has transplant eval scheduled for 6/19  STUDIES:  5/30 Abdomen / pelvis CT >>> Ascites, small gallstones, left pleural effusion and airspace disease  5/30 GI evaluation >>> significant GI hemorrhage unlikely, no indications for EGD  5/31 TTE >>>nl lv func. Ef 60%, small pericardial effusion.  6/01 diagnostic para>>wbc 136, neutrophil 32, no organisms seen  6/03 paracentesis 4 l per IR  6/08 paracentesis 10.2 liters per IR  LINES / TUBES:  R IJ CVL 5/30 >>>6/9  Foley 5/30 >>>  CULTURES:  5/30 Blood >>> Klebsiella  5/30 Urine >>>negative  6/1 body fluid>>>negative  ANTIBIOTICS:  Zosyn 5/30 >>> 6/2  Vancomycin 5/30 >>> 6/1  Ceftriaxone 6/2 >> 6/08  Cipro 6/08 >> plan date 6/14  ASSESSMENT / PLAN:  Septic shock 2nd to Klebsiella bacteremia >> shock resolved.  Plan:  Day 11/14 of Abx, currently on cipro   Hx of Hep C with cirrhosis, ascites.  N/V >> improved.  Protein calorie malnutrition.  Plan:  Hold lasix, aldactone in setting of hypotension  Has [facility] evaluation at [facility] for June 19  BID protonix  PRN paracentesis if he develops respiratory distress from ascites  Pleural effusion in setting of cirrhosis with ascites.  Plan:  Monitor clinically  Thrombocytopenia >> ?from liver failure >> improving.  Hx of CLL.  Coagulopathy 2nd to liver disease.  Plan:  F/u CBC  Acute kidney injury 2nd to ATN, shock >> resolved.  Hypoglycemia >> resolved.  Disposition >> He has returned to ICU following brief resp arrest 6/9 ?transient air embolism after CVL removed. His poor physical condition may necessitate hospital to hospital transfer >> don't think he would be stable enough to be managed at nursing home. He has been accepted for transfer to medical bed at [facility] by Dr. Young Berry.         Labs at discharge Lab Results  Component Value Date   CREATININE 0.71 11/14/2013   BUN 23 11/14/2013   NA 131* 11/14/2013   K 3.9 11/14/2013   CL 98 11/14/2013   CO2 23 11/14/2013   Lab Results  Component Value Date   WBC 3.6* 11/14/2013   HGB 12.0* 11/14/2013   HCT 34.3* 11/14/2013   MCV 91.0 11/14/2013   PLT 52* 11/14/2013   Lab Results  Component Value Date   ALT 16 11/13/2013   AST 31 11/13/2013   ALKPHOS 68 11/13/2013   BILITOT 2.9* 11/13/2013   Lab  Results  Component Value Date   INR 1.68* 11/10/2013   INR 1.82* 11/08/2013   INR 1.83* 11/06/2013    Current radiology studies US Paracentesis  11/12/2013   CLINICAL DATA:  Patient with history of hepatitis-C, cirrhosis, CLL in remission, recurrent ascites. Request is made for therapeutic large volume paracentesis.  EXAM: ULTRASOUND GUIDED THERAPEUTIC PARACENTESIS  COMPARISON:  PRIOR PARACENTESIS ON 11/07/2013  PROCEDURE: An ultrasound guided paracentesis was thoroughly discussed with the patient and questions answered. The  benefits, risks, alternatives and complications were also discussed. The patient understands and wishes to proceed with the procedure. Written consent was obtained.  Ultrasound was performed to localize and mark an adequate pocket of fluid in the left lower quadrant of the abdomen. The area was then prepped and draped in the normal sterile fashion. 1% Lidocaine was used for local anesthesia. Under ultrasound guidance a 19 gauge Yueh catheter was introduced. Paracentesis was performed. The catheter was removed and a dressing applied.  Complications: None.  FINDINGS: A total of approximately 10.2 liters of yellow fluid was removed.  IMPRESSION: Successful ultrasound guided therapeutic paracentesis yielding 10.2 liters of ascites.  Read by: Rowe Robert ,P.A.-C.   Electronically Signed   By: Jacqulynn Cadet M.D.   On: 11/12/2013 17:16   Dg Chest Port 1 View  11/14/2013   CLINICAL DATA:  Followup pleural effusions  EXAM: PORTABLE CHEST - 1 VIEW  COMPARISON:  11/13/2013  FINDINGS: Cardiac shadow is stable. A left-sided effusion is noted more in the posterior layering position due to patient positioning. No pneumothorax is seen. Mild left basilar atelectasis is noted. Rib fractures are noted on the right with healing.  IMPRESSION: Left-sided atelectasis in the base as well as left-sided pleural effusion layering posteriorly.   Electronically Signed   By: Inez Catalina M.D.   On: 11/14/2013 07:18   Dg Chest Port 1 View  11/13/2013   CLINICAL DATA:  Short of breath. desaturations following removal of central line.  EXAM: PORTABLE CHEST - 1 VIEW  COMPARISON:  11/08/2013.  FINDINGS: The right IJ central line has been removed. Left pleural effusion and left basilar atelectasis remains present. Colonic gaseous distension. Cardiopericardial silhouette appears within normal limits. Aortic arch atherosclerosis.  There is no pneumothorax.  Oxygen tubing projects over the chest.  IMPRESSION: Interval removal of right IJ  central line. Low volume chest with left pleural effusion and left basilar atelectasis. No pneumothorax.   Electronically Signed   By: Dereck Ligas M.D.   On: 11/13/2013 15:17    Disposition:  Final discharge disposition not confirmed  Discharge Instructions   Discharge to SNF when bed available    Complete by:  As directed             Medication List    STOP taking these medications       predniSONE 10 MG tablet  Commonly known as:  DELTASONE      TAKE these medications       ciprofloxacin 500 MG tablet  Commonly known as:  CIPRO  Take 1 tablet (500 mg total) by mouth 2 (two) times daily.     pantoprazole 40 MG tablet  Commonly known as:  PROTONIX  Take 1 tablet (40 mg total) by mouth 2 (two) times daily.     spironolactone 50 MG tablet  Commonly known as:  ALDACTONE  Take 50 mg by mouth daily.     torsemide 20 MG tablet  Commonly known as:  DEMADEX  Take 1  tablet (20 mg total) by mouth 2 (two) times daily.          Discharged Condition: poor  Time spent on discharge greater than 40 minutes.  Vital signs at Discharge. Temp:  [97.4 F (36.3 C)-98.2 F (36.8 C)] 97.6 F (36.4 C) (06/10 0800) Pulse Rate:  [67-110] 89 (06/10 1200) Resp:  [11-27] 13 (06/10 1200) BP: (80-108)/(48-83) 100/62 mmHg (06/10 1200) SpO2:  [74 %-100 %] 99 % (06/10 1200) Weight:  [71.3 kg (157 lb 3 oz)] 71.3 kg (157 lb 3 oz) (06/10 0600) Office follow up Special Information or instructions. Per [employer]. Signed: Richardson Landry Minor ACNP Maryanna Shape PCCM Pager [phone] till 3 pm If no answer page [phone] 11/14/2013, 12:40 PM     [name], MD [place] 11/14/2013, 1:40 PM Pager:  [phone] After 3pm call: [phone]

## 2013-11-14 NOTE — Progress Notes (Signed)
PULMONARY / CRITICAL CARE MEDICINE  Name: Tyler Huang MRN: [id number] DOB: June 21, 1950    ADMISSION DATE:  11/03/2013  REFERRING MD :  EDP PRIMARY SERVICE:  PCCM  CHIEF COMPLAINT:  Hypotension  BRIEF PATIENT DESCRIPTION:  63 yo male presented with weakness, n/v, hypotension sepsis.  Hx of Hep C with cirrhosis, CLL >> in remission since 2014.  PCCM asked to admit to ICU.  SIGNIFICANT EVENTS: 5/30 Admit, GI consulted 6/05 not candidate for CIR 6/09 when cvl dc'd he has resp distress, apnea and code was called for apnea. Transferred back to ICU. 6/10 attempt to transfer to [facility] as he has transplant eval scheduled for 6/19  STUDIES:  5/30 Abdomen / pelvis CT >>> Ascites, small gallstones, left pleural effusion and airspace disease 5/30 GI evaluation >>> significant GI hemorrhage unlikely, no indications for EGD 5/31 TTE >>>nl lv func. Ef 60%, small pericardial effusion. 6/01 diagnostic para>>wbc 136, neutrophil 32, no organisms seen  6/03 paracentesis 4 l per IR 6/08 paracentesis 10.2 liters per IR  LINES / TUBES: R IJ CVL 5/30 >>>6/9 Foley 5/30 >>>  CULTURES: 5/30 Blood >>> Klebsiella 5/30 Urine >>>negative 6/1 body fluid>>>negative  ANTIBIOTICS: Zosyn 5/30 >>> 6/2 Vancomycin 5/30 >>> 6/1 Ceftriaxone 6/2 >> 6/08 Cipro 6/08 >>   INTERVAL HISTORY:  C/o nausea.  VITAL SIGNS: Temp:  [97.4 F (36.3 C)-98.2 F (36.8 C)] 97.6 F (36.4 C) (06/10 0800) Pulse Rate:  [67-110] 77 (06/10 0800) Resp:  [11-27] 11 (06/10 0800) BP: (80-108)/(48-83) 100/63 mmHg (06/10 0800) SpO2:  [74 %-100 %] 100 % (06/10 0800) Weight:  [71.3 kg (157 lb 3 oz)] 71.3 kg (157 lb 3 oz) (06/10 0600) INTAKE / OUTPUT: Intake/Output     06/09 0701 - 06/10 0700 06/10 0701 - 06/11 0700   P.O. 200 180   I.V. (mL/kg) 190 (2.7)    Total Intake(mL/kg) 390 (5.5) 180 (2.5)   Urine (mL/kg/hr) 200 (0.1)    Total Output 200     Net +190 +180        Urine Occurrence 2 x    Stool Occurrence 1 x      PHYSICAL EXAMINATION: General: cachetic, NAD Neuro: alert, normal strength HEENT:  Temporal wasting Cardiovascular:  Regular, no murmurs Lungs: decreased bs at bases Abdomen: soft, non tender, decreased ascites from recent paracentesis.  Musculoskeletal: 1+ edema Skin:  Multiple ecchymoses  LABS: CBC  Recent Labs Lab 11/13/13 0420 11/13/13 2051 11/14/13 0339  WBC 4.1 4.4 3.6*  HGB 12.3* 12.4* 12.0*  HCT 34.7* 35.8* 34.3*  PLT 49* 55* 52*   Coag's  Recent Labs Lab 11/08/13 0433 11/10/13 0530  INR 1.82* 1.68*   BMET  Recent Labs Lab 11/13/13 0420 11/13/13 2051 11/14/13 0339  NA 130* 132* 131*  K 3.5* 3.9 3.9  CL 96 97 98  CO2 24 23 23   BUN 23 23 23   CREATININE 0.74 0.75 0.71  GLUCOSE 116* 121* 92   Electrolytes  Recent Labs Lab 11/08/13 0433 11/09/13 0525  11/13/13 0420 11/13/13 2051 11/14/13 0339  CALCIUM 7.4* 7.6*  < > 7.4* 7.6* 7.5*  MG 1.8 1.8  --   --  1.6  --   PHOS 2.1* 2.6  --   --   --   --   < > = values in this interval not displayed. Sepsis Markers  Recent Labs Lab 11/08/13 0925 11/09/13 0525 11/10/13 0530  LATICACIDVEN 3.4*  --   --   PROCALCITON 0.85 0.69 0.44   Liver Enzymes  Recent Labs Lab 11/13/13 0420 11/13/13 2051  AST 26 31  ALT 16 16  ALKPHOS 70 68  BILITOT 2.4* 2.9*  ALBUMIN 2.0* 2.4*   Glucose  Recent Labs Lab 11/07/13 1546 11/07/13 1951 11/08/13 0529 11/08/13 0806 11/08/13 1304 11/13/13 1636  GLUCAP 96 127* 93 115* 103* 81   IMAGING:  US Paracentesis  11/12/2013   CLINICAL DATA:  Patient with history of hepatitis-C, cirrhosis, CLL in remission, recurrent ascites. Request is made for therapeutic large volume paracentesis.  EXAM: ULTRASOUND GUIDED THERAPEUTIC PARACENTESIS  COMPARISON:  PRIOR PARACENTESIS ON 11/07/2013  PROCEDURE: An ultrasound guided paracentesis was thoroughly discussed with the patient and questions answered. The benefits, risks, alternatives and complications were also discussed.  The patient understands and wishes to proceed with the procedure. Written consent was obtained.  Ultrasound was performed to localize and mark an adequate pocket of fluid in the left lower quadrant of the abdomen. The area was then prepped and draped in the normal sterile fashion. 1% Lidocaine was used for local anesthesia. Under ultrasound guidance a 19 gauge Yueh catheter was introduced. Paracentesis was performed. The catheter was removed and a dressing applied.  Complications: None.  FINDINGS: A total of approximately 10.2 liters of yellow fluid was removed.  IMPRESSION: Successful ultrasound guided therapeutic paracentesis yielding 10.2 liters of ascites.  Read by: Rowe Robert ,P.A.-C.   Electronically Signed   By: Jacqulynn Cadet M.D.   On: 11/12/2013 17:16   Dg Chest Port 1 View  11/14/2013   CLINICAL DATA:  Followup pleural effusions  EXAM: PORTABLE CHEST - 1 VIEW  COMPARISON:  11/13/2013  FINDINGS: Cardiac shadow is stable. A left-sided effusion is noted more in the posterior layering position due to patient positioning. No pneumothorax is seen. Mild left basilar atelectasis is noted. Rib fractures are noted on the right with healing.  IMPRESSION: Left-sided atelectasis in the base as well as left-sided pleural effusion layering posteriorly.   Electronically Signed   By: Inez Catalina M.D.   On: 11/14/2013 07:18   Dg Chest Port 1 View  11/13/2013   CLINICAL DATA:  Short of breath. desaturations following removal of central line.  EXAM: PORTABLE CHEST - 1 VIEW  COMPARISON:  11/08/2013.  FINDINGS: The right IJ central line has been removed. Left pleural effusion and left basilar atelectasis remains present. Colonic gaseous distension. Cardiopericardial silhouette appears within normal limits. Aortic arch atherosclerosis.  There is no pneumothorax.  Oxygen tubing projects over the chest.  IMPRESSION: Interval removal of right IJ central line. Low volume chest with left pleural effusion and left basilar  atelectasis. No pneumothorax.   Electronically Signed   By: Dereck Ligas M.D.   On: 11/13/2013 15:17    ASSESSMENT / PLAN:  Septic shock 2nd to Klebsiella bacteremia >> shock resolved. Plan: Day 11/14 of Abx, currently on cipro  Hx of Hep C with cirrhosis, ascites. N/V >> improved. Protein calorie malnutrition. Plan: Hold lasix, aldactone in setting of hypotension Has transplant evaluation at [facility] for June 19 BID protonix PRN paracentesis if he develops respiratory distress from ascites  Pleural effusion in setting of cirrhosis with ascites. Plan: Monitor clinically  Thrombocytopenia >> ?from liver failure >> improving. Hx of CLL. Coagulopathy 2nd to liver disease. Plan: F/u CBC  Acute kidney injury 2nd to ATN, shock >> resolved. Hypoglycemia >> resolved.  Disposition >> He has returned to ICU following brief resp arrest 6/9 ?transient air embolism after CVL removed. His poor physical condition may necessitate  hospital to hospital transfer >> don't think he would be stable enough to be managed at nursing home.  He has been accepted for transfer to medical bed at [facility] by Dr. Young Berry.  Richardson Landry Minor ACNP Maryanna Shape PCCM Pager [phone] till 3 pm If no answer page [phone] 11/14/2013, 9:59 AM     Reviewed above, examined and documentation changes made as needed.  Will continue to monitor in SDU until he has bed available at [facility].  [name], MD [facility] Pulmonary/Critical Care 11/14/2013, 10:45 AM Pager:  [phone] After 3pm call: [phone]

## 2013-12-05 ENCOUNTER — Non-Acute Institutional Stay (SKILLED_NURSING_FACILITY): Payer: Medicaid Other | Admitting: Internal Medicine

## 2013-12-05 DIAGNOSIS — I951 Orthostatic hypotension: Secondary | ICD-10-CM

## 2013-12-05 DIAGNOSIS — A419 Sepsis, unspecified organism: Secondary | ICD-10-CM

## 2013-12-05 DIAGNOSIS — C9110 Chronic lymphocytic leukemia of B-cell type not having achieved remission: Secondary | ICD-10-CM

## 2013-12-05 DIAGNOSIS — R6521 Severe sepsis with septic shock: Secondary | ICD-10-CM

## 2013-12-05 DIAGNOSIS — R6520 Severe sepsis without septic shock: Secondary | ICD-10-CM

## 2013-12-05 DIAGNOSIS — E43 Unspecified severe protein-calorie malnutrition: Secondary | ICD-10-CM

## 2013-12-05 DIAGNOSIS — K7460 Unspecified cirrhosis of liver: Secondary | ICD-10-CM

## 2013-12-05 DIAGNOSIS — B180 Chronic viral hepatitis B with delta-agent: Secondary | ICD-10-CM

## 2013-12-05 NOTE — Progress Notes (Signed)
Patient ID: Tyler Huang, male   DOB: January 06, 1951, 63 y.o.   MRN: [id number]  Provider:  Rexene Edison. Mariea Clonts, D.O., C.M.D. Location:  [facility] PCP: No primary provider on file.  Code Status: full code  No Known Allergies  Chief Complaint  Patient presents with  . Hospitalization Follow-up    new admission s/p prolonged hospitalization with septic shock with gram negative bacteremia, GI hemorrhage, coagulopathy, pancytopenia, severe protein calorie malnutrition, prior CLL in remission;  was transferred to [facility] 6/10 for liver transplant eval due to Hep B cirrhosis     HPI: 63 y.o. male admitted here for rehab s/p prolonged hospitalization as above.  He has cirrhosis from hep B and all of the associated lab findings.  He is very weak and fatigued, dizzy on standing.  He also seems depressed, but also lacks insight into his prognosis (unclear how much he's actually been told at this point).    ROS: Review of Systems  Constitutional: Positive for weight loss and malaise/fatigue. Negative for fever and chills.  HENT: Negative for congestion.   Eyes: Negative for blurred vision.  Respiratory: Positive for shortness of breath.   Cardiovascular: Negative for chest pain.  Gastrointestinal: Negative for abdominal pain, constipation, blood in stool and melena.  Genitourinary: Negative for dysuria.  Musculoskeletal: Positive for myalgias and falls.  Skin: Negative for rash.  Neurological: Positive for dizziness and weakness. Negative for seizures and loss of consciousness.  Endo/Heme/Allergies: Bruises/bleeds easily.  Psychiatric/Behavioral: Positive for depression. Negative for memory loss.     Past Medical History  Diagnosis Date  . Cancer   . Hepatitis B   . Cirrhosis    No past surgical history on file. Social History:   reports that he has quit smoking. His smoking use included Cigarettes. He smoked 0.00 packs per day. He does not have any smokeless  tobacco history on file. He reports that he does not drink alcohol or use illicit drugs.  No family history on file.  Medications: Patient's Medications  New Prescriptions   No medications on file  Previous Medications   CIPROFLOXACIN (CIPRO) 500 MG TABLET    Take 1 tablet (500 mg total) by mouth 2 (two) times daily.   PANTOPRAZOLE (PROTONIX) 40 MG TABLET    Take 1 tablet (40 mg total) by mouth 2 (two) times daily.   SPIRONOLACTONE (ALDACTONE) 50 MG TABLET    Take 50 mg by mouth daily.   TORSEMIDE (DEMADEX) 20 MG TABLET    Take 1 tablet (20 mg total) by mouth 2 (two) times daily.  Modified Medications   No medications on file  Discontinued Medications   No medications on file     Physical Exam: There were no vitals filed for this visit. Physical Exam  Constitutional: He is oriented to person, place, and time.  Cachectic appearing white male with ascites  HENT:  Head: Normocephalic and atraumatic.  Right Ear: External ear normal.  Left Ear: External ear normal.  Nose: Nose normal.  Mouth/Throat: Oropharynx is clear and moist.  Eyes: Conjunctivae and EOM are normal. Pupils are equal, round, and reactive to light. Scleral icterus is present.  glasses  Neck: Neck supple. No JVD present.  Cardiovascular: Normal rate, regular rhythm, normal heart sounds and intact distal pulses.   Pulmonary/Chest: Effort normal and breath sounds normal. He has no rales.  Abdominal: Soft. Bowel sounds are normal. He exhibits distension. He exhibits no mass. There is no tenderness.  Musculoskeletal: Normal range  of motion. He exhibits no edema or tenderness.  Neurological: He is alert and oriented to person, place, and time. No cranial nerve deficit.  Skin: Skin is warm and dry.  Multiple ecchymotic areas on arms and legs  Psychiatric:  Flat affect     Labs reviewed: Basic Metabolic Panel:  Recent Labs  11/07/13 0400 11/08/13 0433 11/09/13 0525  11/13/13 0420 11/13/13 2051  11/14/13 0339  NA 130* 131* 134*  < > 130* 132* 131*  K 3.8 3.9 3.6*  < > 3.5* 3.9 3.9  CL 96 97 97  < > 96 97 98  CO2 24 25 24   < > 24 23 23   GLUCOSE 124* 94 105*  < > 116* 121* 92  BUN 28* 27* 24*  < > 23 23 23   CREATININE 0.94 0.84 0.83  < > 0.74 0.75 0.71  CALCIUM 7.5* 7.4* 7.6*  < > 7.4* 7.6* 7.5*  MG 1.9 1.8 1.8  --   --  1.6  --   PHOS 2.7 2.1* 2.6  --   --   --   --   < > = values in this interval not displayed. Liver Function Tests:  Recent Labs  11/07/13 0400 11/13/13 0420 11/13/13 2051  AST 31 26 31   ALT 19 16 16   ALKPHOS 47 70 68  BILITOT 2.1* 2.4* 2.9*  PROT 3.5* 3.6* 3.7*  ALBUMIN 2.0* 2.0* 2.4*   No results found for this basename: LIPASE, AMYLASE,  in the last 8760 hours  Recent Labs  11/03/13 0916 11/08/13 0950  AMMONIA 28 17   CBC:  Recent Labs  11/03/13 0916  11/13/13 0420 11/13/13 2051 11/14/13 0339  WBC 2.1*  < > 4.1 4.4 3.6*  NEUTROABS 1.4*  --   --   --   --   HGB 15.8  < > 12.3* 12.4* 12.0*  HCT 45.5  < > 34.7* 35.8* 34.3*  MCV 91.5  < > 91.6 91.1 91.0  PLT 102*  < > 49* 55* 52*  < > = values in this interval not displayed. Cardiac Enzymes:  Recent Labs  11/03/13 1200 11/03/13 1600 11/04/13 0015  TROPONINI 1.10* 1.29* 1.42*   BNP: No components found with this basename: POCBNP,  CBG:  Recent Labs  11/08/13 0806 11/08/13 1304 11/13/13 1636  GLUCAP 115* 103* 81   Assessment/Plan 1. Hepatic cirrhosis, unspecified hepatic cirrhosis type -due to hepatitis B  -appears this is end stage based on his hyponatremia, thrombocytopenia, INR, and recurrent ascites -he has come here for therapy and strengthening after septic shock from gram negative bacteremia -on first glance, I do not foresee a great improvement upcoming with his severe cachexia; however, he has a lot of hope so will monitor and see how he does with therapy for a little bit  2. 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 Orthostatic hypotension -related to his cirrhosis -is on fluid restriction to help limit recurrent ascites -increase awareness of standing still before ambulating  4. Severe sepsis with septic shock -finishing course of cipro  5. Protein-calorie malnutrition, severe -due to cirrhosis, CLL s/p treatment, and recent septic shock -prognosis poor, but his personal outlook is that he is going to survive and get hep b treatment -supplements will be provided and liberal diet (aside from fluid restriction) to promote some weight gain  6. Chronic lymphocytic leukemia -has had treatment for  this, but is chronic  Functional status:  Currently requiring adl assist except feeding due to extreme weakness, had been completely independent prior to hospitalization  Family/ staff Communication: his sister is involved;  Discussed with nursing and therapy  Labs/tests ordered:  Cbc, cmp

## 2013-12-18 ENCOUNTER — Encounter: Payer: Self-pay | Admitting: Internal Medicine

## 2013-12-18 ENCOUNTER — Non-Acute Institutional Stay (SKILLED_NURSING_FACILITY): Payer: Medicaid Other | Admitting: Internal Medicine

## 2013-12-18 DIAGNOSIS — D61818 Other pancytopenia: Secondary | ICD-10-CM

## 2013-12-18 DIAGNOSIS — K7460 Unspecified cirrhosis of liver: Secondary | ICD-10-CM | POA: Insufficient documentation

## 2013-12-18 DIAGNOSIS — B1910 Unspecified viral hepatitis B without hepatic coma: Secondary | ICD-10-CM

## 2013-12-18 DIAGNOSIS — I951 Orthostatic hypotension: Secondary | ICD-10-CM

## 2013-12-18 NOTE — Progress Notes (Signed)
MRN: [id number] Name: Tyler Huang  Sex: male Age: 63 y.o. DOB: 17-Feb-1951  [place] #: [facility] Facility/Room: 128A Level Of Care: SNF Provider: Inocencio Homes D Emergency Contacts: Extended Emergency Contact Information Primary Emergency Contact: Kirven,Jan Address: [address]          [place], [place] [address] Johnnette Litter of [place] Phone: [phone] Relation: Sister Secondary Emergency Contact: White,Chris  [place] Mobile Phone: [phone] Relation: None  Code Status:   Allergies: Ciprofloxacin  Chief Complaint  Patient presents with  . Medical Management of Chronic Issues    HPI: Patient is 63 y.o. male who PT and nursing asked me to see because of decreased BP in therapy.  Past Medical History  Diagnosis Date  . Hepatitis B     non IVD  . Cirrhosis   . Cancer     CLL    History reviewed. No pertinent past surgical history.    Medication List       This list is accurate as of: 12/18/13 11:59 PM.  Always use your most recent med list.               entecavir 1 MG tablet  Commonly known as:  BARACLUDE  Take 1 mg by mouth daily.     furosemide 80 MG tablet  Commonly known as:  LASIX  Take 80 mg by mouth daily.     spironolactone 100 MG tablet  Commonly known as:  ALDACTONE  Take 200 mg by mouth daily.        Meds ordered this encounter  Medications  . furosemide (LASIX) 80 MG tablet    Sig: Take 80 mg by mouth daily.   Marland Kitchen spironolactone (ALDACTONE) 100 MG tablet    Sig: Take 200 mg by mouth daily.  Marland Kitchen entecavir (BARACLUDE) 1 MG tablet    Sig: Take 1 mg by mouth daily.     There is no immunization history on file for this patient.  History  Substance Use Topics  . Smoking status: Former Smoker    Types: Cigarettes  . Smokeless tobacco: Not on file  . Alcohol Use: 0.5 oz/week    1 drink(s) per week    Review of Systems  DATA OBTAINED: from patient, nurse,PT  Reported 7/13 sitting 89/64, standing 57/42  with dizzy; sitting 85/63, standing 57/38; sitting 91/67, standing 65/51 with SOB and dizzy  GENERAL: Feels well no fevers, fatigue, appetite changes SKIN: No itching, rash HEENT: No complaint RESPIRATORY: No cough, wheezing, SOB CARDIAC: No chest pain, palpitations, lower extremity edema  GI: No abdominal pain, No N/V/D or constipation, No heartburn or reflux  GU: No dysuria, frequency or urgency, or incontinence  MUSCULOSKELETAL: No unrelieved bone/joint pain NEUROLOGIC: No headache, PSYCHIATRIC: No overt anxiety or sadness. Sleeps well.   Filed Vitals:   12/18/13 1800  BP: 118/68  Pulse: 98  Temp: 97.7 F (36.5 C)  Resp: 20    Physical Exam  GENERAL APPEARANCE: Alert, conversant. Appropriately groomed. No acute distress  SKIN: No diaphoresis rash HEENT: Unremarkable RESPIRATORY: Breathing is even, unlabored. Lung sounds are clear   CARDIOVASCULAR: Heart RRR no murmurs, rubs or gallops. No peripheral edema  GASTROINTESTINAL: Abdomen is soft, non-tender, not distended w/ normal bowel sounds.  GENITOURINARY: Bladder non tender, not distended  MUSCULOSKELETAL: No abnormal joints or musculature NEUROLOGIC: Cranial nerves 2-12 grossly intact. Moves all extremities no tremor. PSYCHIATRIC: Mood and affect appropriate to situation, no behavioral issues  Patient Active Problem List   Diagnosis  Date Noted  . Orthostatic hypotension 12/23/2013  . Cancer   . Hepatitis B   . Cirrhosis   . Protein-calorie malnutrition, severe 11/06/2013  . Bacteremia due to Gram-negative bacteria 11/05/2013  . Septic shock(785.52) 11/03/2013  . GI hemorrhage 11/03/2013  . Coagulopathy 11/03/2013  . Pancytopenia 11/03/2013    CBC    Component Value Date/Time   WBC 3.6* 11/14/2013 0339   RBC 3.77* 11/14/2013 0339   HGB 12.0* 11/14/2013 0339   HCT 34.3* 11/14/2013 0339   PLT 52* 11/14/2013 0339   MCV 91.0 11/14/2013 0339   LYMPHSABS 0.2* 11/03/2013 0916   MONOABS 0.5 11/03/2013 0916   EOSABS 0.0  11/03/2013 0916   BASOSABS 0.0 11/03/2013 0916    CMP     Component Value Date/Time   NA 131* 11/14/2013 0339   K 3.9 11/14/2013 0339   CL 98 11/14/2013 0339   CO2 23 11/14/2013 0339   GLUCOSE 92 11/14/2013 0339   BUN 23 11/14/2013 0339   CREATININE 0.71 11/14/2013 0339   CALCIUM 7.5* 11/14/2013 0339   PROT 3.7* 11/13/2013 2051   ALBUMIN 2.4* 11/13/2013 2051   AST 31 11/13/2013 2051   ALT 16 11/13/2013 2051   ALKPHOS 68 11/13/2013 2051   BILITOT 2.9* 11/13/2013 2051   GFRNONAA >90 11/14/2013 0339   GFRAA >90 11/14/2013 0339    Assessment and Plan  Hepatitis B On baraclude  Cirrhosis Was on spironolactone 100mg  daily gradually increased to 200 mg daily. Lasix 40 mg daily gradually increased to  80 mg daily.;I now plan to decrease pt back to spironolactone 100 mg daily and lasix 40 mg 2/2 orthostatic hypotension  Pancytopenia Bone marrow bx neg for CA;it was felt pancytopenia (nadir WBC 0.5 with ANC 0.2) was 2/2 CIPRO;treated with GM-CSF; as of 11/2013 bone marrow had recovered  Orthostatic hypotension 2/2 too much diuretic;dec med doses but will monitor if edema and/or ascites developes    Hennie Duos, MD

## 2013-12-23 ENCOUNTER — Encounter: Payer: Self-pay | Admitting: Internal Medicine

## 2013-12-23 DIAGNOSIS — C801 Malignant (primary) neoplasm, unspecified: Secondary | ICD-10-CM | POA: Insufficient documentation

## 2013-12-23 DIAGNOSIS — I951 Orthostatic hypotension: Secondary | ICD-10-CM | POA: Insufficient documentation

## 2013-12-23 DIAGNOSIS — B1910 Unspecified viral hepatitis B without hepatic coma: Secondary | ICD-10-CM | POA: Insufficient documentation

## 2013-12-23 NOTE — Assessment & Plan Note (Signed)
On baraclude

## 2013-12-23 NOTE — Assessment & Plan Note (Signed)
Was on spironolactone 100mg  daily gradually increased to 200 mg daily. Lasix 40 mg daily gradually increased to  80 mg daily.;I now plan to decrease pt back to spironolactone 100 mg daily and lasix 40 mg 2/2 orthostatic hypotension

## 2013-12-23 NOTE — Assessment & Plan Note (Addendum)
Bone marrow bx neg for CA;it was felt pancytopenia (nadir WBC 0.5 with ANC 0.2) was 2/2 CIPRO;treated with GM-CSF; as of 11/2013 bone marrow had recovered

## 2013-12-23 NOTE — Assessment & Plan Note (Addendum)
2/2 too much diuretic;dec med doses as above in cirrhosis but will monitor if/when edema and/or ascites developes

## 2014-01-02 ENCOUNTER — Non-Acute Institutional Stay (SKILLED_NURSING_FACILITY): Payer: Medicaid Other | Admitting: Internal Medicine

## 2014-01-02 DIAGNOSIS — C9110 Chronic lymphocytic leukemia of B-cell type not having achieved remission: Secondary | ICD-10-CM

## 2014-01-02 DIAGNOSIS — I951 Orthostatic hypotension: Secondary | ICD-10-CM

## 2014-01-02 DIAGNOSIS — B180 Chronic viral hepatitis B with delta-agent: Secondary | ICD-10-CM

## 2014-01-02 DIAGNOSIS — E43 Unspecified severe protein-calorie malnutrition: Secondary | ICD-10-CM

## 2014-01-02 DIAGNOSIS — D61818 Other pancytopenia: Secondary | ICD-10-CM

## 2014-01-02 DIAGNOSIS — K7460 Unspecified cirrhosis of liver: Secondary | ICD-10-CM

## 2014-01-15 ENCOUNTER — Non-Acute Institutional Stay (SKILLED_NURSING_FACILITY): Payer: Medicaid Other | Admitting: Internal Medicine

## 2014-01-15 ENCOUNTER — Encounter: Payer: Self-pay | Admitting: Internal Medicine

## 2014-01-15 DIAGNOSIS — I951 Orthostatic hypotension: Secondary | ICD-10-CM

## 2014-01-15 DIAGNOSIS — J189 Pneumonia, unspecified organism: Secondary | ICD-10-CM

## 2014-01-15 DIAGNOSIS — Z7189 Other specified counseling: Secondary | ICD-10-CM

## 2014-01-15 NOTE — Assessment & Plan Note (Signed)
This has been the major impediment to PT.Maybe when paracentesis starts we can back off the diuretics some and get him up.

## 2014-01-15 NOTE — Progress Notes (Signed)
MRN: [id number] Name: Tyler Huang  Sex: male Age: 63 y.o. DOB: 1950-10-01  [place] #: [facility] Facility/Room: 128A Level Of Care: SNF Provider: Inocencio Homes D Emergency Contacts: Extended Emergency Contact Information Primary Emergency Contact: Kirven,Jan Address: [address]          [place], [place] [address] Johnnette Litter of [place] Phone: [phone] Relation: Sister Secondary Emergency Contact: White,Chris  [place] Phone: [phone] Relation: None   Allergies: Ciprofloxacin  Chief Complaint  Patient presents with  . family conference with patient present    HPI: Patient is 63 y.o. male who has endstage non alcoholic Hepatitis B cirrhosis admitted to SNF after gram neg bacteremia, a GI bleed, a CODE BLUE and other adventures, being seen for a family conference.  Past Medical History  Diagnosis Date  . Hepatitis B     non IVD  . Cirrhosis   . Cancer     CLL    History reviewed. No pertinent past surgical history.    Medication List       This list is accurate as of: 01/15/14  9:36 PM.  Always use your most recent med list.               doxycycline 100 MG DR capsule  Commonly known as:  DORYX  Take 100 mg by mouth 2 (two) times daily. For 10 days     entecavir 1 MG tablet  Commonly known as:  BARACLUDE  Take 1 mg by mouth daily.     furosemide 80 MG tablet  Commonly known as:  LASIX  Take 80 mg by mouth daily.     spironolactone 100 MG tablet  Commonly known as:  ALDACTONE  Take 200 mg by mouth daily.        Meds ordered this encounter  Medications  . doxycycline (DORYX) 100 MG DR capsule    Sig: Take 100 mg by mouth 2 (two) times daily. For 10 days     There is no immunization history on file for this patient.  History  Substance Use Topics  . Smoking status: Former Smoker    Types: Cigarettes  . Smokeless tobacco: Not on file  . Alcohol Use: 0.5 oz/week    1 drink(s) per week    Review of  Systems  DATA OBTAINED: from patient GENERAL: no fevers, fatigue, appetite changes SKIN: No itching, rash HEENT: No complaint RESPIRATORY: + cough, no wheezing, some SOB from ascites CARDIAC: No chest pain, palpitations, lower extremity edema  GI: No abdominal pain, No N/V/D or constipation, No heartburn or reflux  GU: No dysuria, frequency or urgency, or incontinence  MUSCULOSKELETAL: No unrelieved bone/joint pain NEUROLOGIC: No headache, + dizziness with sitting and standing. PSYCHIATRIC: No overt anxiety or sadness. Sleeps well.   Filed Vitals:   01/15/14 2045  BP: 110/72  Pulse: 88  Temp: 97.9 F (36.6 C)  Resp: 20    Physical Exam  GENERAL APPEARANCE: Alert, conversant. Appropriately groomed. No acute distress  SKIN: No diaphoresis rash, or wounds HEENT: Unremarkable RESPIRATORY: Breathing is even, unlabored. Lung sounds are decreased on left  CARDIOVASCULAR: Heart RRR no murmurs, rubs or gallops. No peripheral edema  GASTROINTESTINAL: Abdomen is soft, non-tender, + distended but not tense w/ normal bowel sounds.  GENITOURINARY: Bladder non tender  MUSCULOSKELETAL: No abnormal joints or musculature NEUROLOGIC: Cranial nerves 2-12 grossly intact. Moves all extremities no tremor. PSYCHIATRIC: Mood and affect appropriate to situation, no behavioral issues  Patient Active Problem List  Diagnosis Date Noted  . HCAP (healthcare-associated pneumonia) 01/15/2014  . Orthostatic hypotension 12/23/2013  . Cancer   . Hepatitis B   . Cirrhosis   . Protein-calorie malnutrition, severe 11/06/2013  . Bacteremia due to Gram-negative bacteria 11/05/2013  . Septic shock(785.52) 11/03/2013  . GI hemorrhage 11/03/2013  . Coagulopathy 11/03/2013  . Pancytopenia 11/03/2013    CBC    Component Value Date/Time   WBC 3.6* 11/14/2013 0339   RBC 3.77* 11/14/2013 0339   HGB 12.0* 11/14/2013 0339   HCT 34.3* 11/14/2013 0339   PLT 52* 11/14/2013 0339   MCV 91.0 11/14/2013 0339    LYMPHSABS 0.2* 11/03/2013 0916   MONOABS 0.5 11/03/2013 0916   EOSABS 0.0 11/03/2013 0916   BASOSABS 0.0 11/03/2013 0916    CMP     Component Value Date/Time   NA 131* 11/14/2013 0339   K 3.9 11/14/2013 0339   CL 98 11/14/2013 0339   CO2 23 11/14/2013 0339   GLUCOSE 92 11/14/2013 0339   BUN 23 11/14/2013 0339   CREATININE 0.71 11/14/2013 0339   CALCIUM 7.5* 11/14/2013 0339   PROT 3.7* 11/13/2013 2051   ALBUMIN 2.4* 11/13/2013 2051   AST 31 11/13/2013 2051   ALT 16 11/13/2013 2051   ALKPHOS 68 11/13/2013 2051   BILITOT 2.9* 11/13/2013 2051   GFRNONAA >90 11/14/2013 0339   GFRAA >90 11/14/2013 0339    Assessment and Plan  FAMILY CONFERENCE WITH PATIENT PRESENT - pt's sister wanted an update. Pt was discharged to SNF with < 24 hours notice to her, his only relative. She is not sure where everything stands. It was a mad rush from [place] to [place] to SNF. Pt was sent to [facility] for a liver transplant; not possible 2/2 CLL with tx in past year. We discussed pericentesis which she didn't know had been set up for pt, and he needs it now I discovered today. He needs to be set up for GI here in [place] written for. She was told in June he had 3-6 months without transplant. If he doesn't get septic or CODE again.....sister is very realistic.  HCAP (healthcare-associated pneumonia) Pt was started on Doxycycline for PNA dx over the weekend. Review of the CXR showed almost opacity on the left, yet the patient does not look that sick or c/o pain. It may be ascitic fluid.Will first repeat CXR, then consider CT depending on results.  Orthostatic hypotension This has been the major impediment to PT.Maybe when paracentesis starts we can back off the diuretics some and get him up.    Hennie Duos, MD

## 2014-01-15 NOTE — Assessment & Plan Note (Addendum)
Pt was started on Doxycycline for PNA dx over the weekend. Review of the CXR showed almost opacity on the left, yet the patient does not look that sick or c/o pain. It may be ascitic fluid.Will first repeat CXR, then consider CT depending on results.

## 2014-01-23 ENCOUNTER — Other Ambulatory Visit: Payer: Self-pay | Admitting: Internal Medicine

## 2014-01-23 DIAGNOSIS — K759 Inflammatory liver disease, unspecified: Secondary | ICD-10-CM

## 2014-01-24 ENCOUNTER — Other Ambulatory Visit: Payer: Self-pay | Admitting: Internal Medicine

## 2014-01-24 ENCOUNTER — Ambulatory Visit (HOSPITAL_COMMUNITY)
Admission: RE | Admit: 2014-01-24 | Discharge: 2014-01-24 | Disposition: A | Discharge status: Home / Self Care | Payer: Medicaid Other | Source: Ambulatory Visit | Attending: Internal Medicine | Admitting: Internal Medicine

## 2014-01-24 VITALS — BP 91/57

## 2014-01-24 DIAGNOSIS — B182 Chronic viral hepatitis C: Secondary | ICD-10-CM | POA: Insufficient documentation

## 2014-01-24 DIAGNOSIS — K7460 Unspecified cirrhosis of liver: Secondary | ICD-10-CM | POA: Insufficient documentation

## 2014-01-24 DIAGNOSIS — K759 Inflammatory liver disease, unspecified: Secondary | ICD-10-CM

## 2014-01-24 DIAGNOSIS — R188 Other ascites: Secondary | ICD-10-CM | POA: Insufficient documentation

## 2014-01-24 NOTE — Procedures (Signed)
Successful US guided paracentesis from right middle abdominal region.  Yielded 3.9 liters of clear yellow fluid.  No immediate complications.  Pt tolerated well.   Specimen was not sent for labs.  Tsosie Billing D PA-C 01/24/2014 2:20 PM

## 2014-01-25 ENCOUNTER — Other Ambulatory Visit: Payer: Self-pay | Admitting: Internal Medicine

## 2014-01-25 DIAGNOSIS — K7460 Unspecified cirrhosis of liver: Secondary | ICD-10-CM

## 2014-01-25 NOTE — Progress Notes (Signed)
Dr. Shelbie Proctor from hospice contacted me about Mr. Rigg's hospice eligibility--he does not qualify at this time with his goals to still return to the hospital for paracenteses and ongoing baraclude treatment (he desires to continue at present--hep B cirrhosis).  I will ask for palliative care assistance from [facility] to further assist with goals of care for him.

## 2014-01-30 ENCOUNTER — Non-Acute Institutional Stay (SKILLED_NURSING_FACILITY): Payer: Medicaid Other | Admitting: Internal Medicine

## 2014-01-30 ENCOUNTER — Encounter: Payer: Self-pay | Admitting: Internal Medicine

## 2014-01-30 DIAGNOSIS — R188 Other ascites: Secondary | ICD-10-CM

## 2014-01-30 DIAGNOSIS — R627 Adult failure to thrive: Secondary | ICD-10-CM

## 2014-01-30 DIAGNOSIS — I951 Orthostatic hypotension: Secondary | ICD-10-CM

## 2014-01-30 DIAGNOSIS — B181 Chronic viral hepatitis B without delta-agent: Secondary | ICD-10-CM

## 2014-01-30 DIAGNOSIS — Z7189 Other specified counseling: Secondary | ICD-10-CM

## 2014-01-30 DIAGNOSIS — D61818 Other pancytopenia: Secondary | ICD-10-CM

## 2014-01-30 DIAGNOSIS — K7460 Unspecified cirrhosis of liver: Secondary | ICD-10-CM

## 2014-01-30 NOTE — Progress Notes (Signed)
Patient ID: Tyler Huang, male   DOB: [birth year], 63 y.o.   MRN: [id number]  Location:  [facility] Provider:  Rexene Edison. Mariea Clonts, D.O., C.M.D.  Code Status:  Full code   Chief Complaint  Patient presents with  . Acute Visit    progression of cirrhosis;  his sister requests a discussion of his prognosis with patient who recently refused hospice with desire to cont hep b treatments    HPI:  63 yo male with h/o hepatitis B on treatment with baraclude, cirrhosis, recurrent ascites, CLL s/p treatment, pancytopenia, severe protein calorie malnutrition, GI bleed, septic shock, and coagulopathy (from cirrhosis) was seen for an acute visit to speak with him and his sister about his poor prognosis.  Apparently, he is under the impression that he will get better and therefore refused hospice care which his sister agrees will benefit him.  I met with his sister and the patient for one hour and 15 mins re: his prognosis.  His sister tells me that at [facility], they told him he was not a candidate for a transplant b/c he had been treated for his CLL within the past 5 years.  He was to get three treatments for his hepatitis B while there and that was it b/c he already had cirrhosis.  However, prior to discharge another physician came in and started him on baraclude long term.  The patient says he was told if he ate a lot of protein, took the medicine regularly and got stronger, he would get better.  Unfortunately, he is taking it, he's getting protein supplements, but he is not getting stronger.  He had pneumonia while here and has not regained his strength.  He was having hypotension and therapy was stopped.  He wants therapy in bed now (discussed restorative nursing can do this).  He continues to lose weight, look more cachectic.  He's required a paracentesis while here where 3 liters of fluid were removed last week.  He remains "stubborn" as his sister says that he will get better.  We discussed  his lack of progress.  We discussed that his prognosis is likely a few months with his weight loss, difficulty tolerating diuretics, falls, and infection risk.  His platelets remain low.  Additional tests to recalculate his Child-Pugh Score will be done as only cbc, bmp were completed this time.  His sister agrees to hospice.  The patient did agree to do not resuscitate code status and revision of his MOST form to DNR, limited interventions, fluids only short term, abx, and possibly a short term feeding tube.  His sister is not in favor of tube feeding and I don't recommend it for him, but he remains able to make his own decisions.      Review of Systems:  Review of Systems  Constitutional: Positive for weight loss and malaise/fatigue. Negative for fever.       States he feels good  HENT: Negative for congestion.   Respiratory: Negative for shortness of breath.   Cardiovascular: Positive for leg swelling. Negative for chest pain.       Of left leg  Gastrointestinal: Negative for abdominal pain, diarrhea, constipation, blood in stool and melena.  Genitourinary: Negative for dysuria.  Musculoskeletal: Positive for falls. Negative for back pain, joint pain, myalgias and neck pain.       [address] out of wheelchair hitting his head and got skin tears on left knee, lower leg, elbow   Skin: Negative for rash.  Easy bruising and skin tears  Neurological: Positive for weakness. Negative for dizziness, loss of consciousness and headaches.  Psychiatric/Behavioral: Negative for depression and memory loss.    Medications: Patient's Medications  New Prescriptions   No medications on file  Previous Medications   DOXYCYCLINE (DORYX) 100 MG DR CAPSULE    Take 100 mg by mouth 2 (two) times daily. For 10 days   ENTECAVIR (BARACLUDE) 1 MG TABLET    Take 1 mg by mouth daily.   FUROSEMIDE (LASIX) 80 MG TABLET    Take 80 mg by mouth daily.    SPIRONOLACTONE (ALDACTONE) 100 MG TABLET    Take 200 mg by mouth  daily.  Modified Medications   No medications on file  Discontinued Medications   No medications on file    Physical Exam: Filed Vitals:   01/30/14 1441  BP: 93/63  Pulse: 98  Temp: 98 F (36.7 C)  Resp: 18  Height: 6' (1.829 m)  Weight: 119 lb (53.978 kg)  SpO2: 98%  Physical Exam  Constitutional: He is oriented to person, place, and time.  Cachectic, slightly jaundice white male  Cardiovascular: Normal rate, regular rhythm, normal heart sounds and intact distal pulses.   Edema left leg  Pulmonary/Chest: Effort normal and breath sounds normal.  Abdominal: Soft. Bowel sounds are normal. He exhibits distension. He exhibits no mass. There is no tenderness.  Ascites present  Musculoskeletal: Normal range of motion. He exhibits no tenderness.  Neurological: He is alert and oriented to person, place, and time.  Skin:  Several ecchymoses of legs, arms, skin tears left elbow, forearm, lower leg and knee  Psychiatric: He has a normal mood and affect.     Labs reviewed: Basic Metabolic Panel:  Recent Labs  11/07/13 0400 11/08/13 0433 11/09/13 0525  11/13/13 0420 11/13/13 2051 11/14/13 0339  NA 130* 131* 134*  < > 130* 132* 131*  K 3.8 3.9 3.6*  < > 3.5* 3.9 3.9  CL 96 97 97  < > 96 97 98  CO2 24 25 24   < > 24 23 23   GLUCOSE 124* 94 105*  < > 116* 121* 92  BUN 28* 27* 24*  < > 23 23 23   CREATININE 0.94 0.84 0.83  < > 0.74 0.75 0.71  CALCIUM 7.5* 7.4* 7.6*  < > 7.4* 7.6* 7.5*  MG 1.9 1.8 1.8  --   --  1.6  --   PHOS 2.7 2.1* 2.6  --   --   --   --   < > = values in this interval not displayed.  Liver Function Tests:  Recent Labs  11/07/13 0400 11/13/13 0420 11/13/13 2051  AST 31 26 31   ALT 19 16 16   ALKPHOS 47 70 68  BILITOT 2.1* 2.4* 2.9*  PROT 3.5* 3.6* 3.7*  ALBUMIN 2.0* 2.0* 2.4*    CBC:  Recent Labs  11/03/13 0916  11/13/13 0420 11/13/13 2051 11/14/13 0339  WBC 2.1*  < > 4.1 4.4 3.6*  NEUTROABS 1.4*  --   --   --   --   HGB 15.8  < > 12.3*  12.4* 12.0*  HCT 45.5  < > 34.7* 35.8* 34.3*  MCV 91.5  < > 91.6 91.1 91.0  PLT 102*  < > 49* 55* 52*  < > = values in this interval not displayed. 01/29/14:  Na 123 (down from 128 7/15), K 4.1, BUN 27.8, cr 0.55, glucose 132, wbc 3.2, h/h 12.2/36.4, plts 69  Assessment/Plan 1. Chronic hepatitis B with cirrhosis -continues on baraclude--unsure of benefit at this stage of his disease -discussed this with him today--he is thinking this over and to discuss more with his sister, but I have recommended he stop it -has been seen by hospice due to his poor prognosis, but he continues on baraclude and has not yet accepted the terminal nature of his disease process -was previously treated for CLL as in hpi -palliative care order was written -MOST form and DNR completed as above  2. Orthostatic hypotension -persistent problem due to his cirrhosis and diuretic use, but when discontinued, he developed immediate increased ascites requiring paracentesis  3. Encounter for family conference with patient present -see hpi above-->1hr 15 min spent with pt and his sister  75. Pancytopenia -due to cirrhosis -platelets 69 this last time  5. Failure to thrive in adult -weight has gone down from 130 lbs at admission to 113lbs today despite proteins supplements and his sister and pt's girlfriend bringing him food (only eats a couple of bites of anything)  6. Ascites -s/p paracentesis last week of 3 liters with symptomatic improvement--no rib/abd pain at present  Family/ staff Communication: as above  Goals of care: see above--was initially her for rehab, but has not done well, now receiving nursing care only and I have ordered palliative care services  Labs/tests ordered:  INR, liver panel ordered to help with prognosis (had bmp, cbc)  1 hour and 15 mins spent with pt and his sister today.

## 2014-02-05 ENCOUNTER — Non-Acute Institutional Stay (SKILLED_NURSING_FACILITY): Payer: Medicaid Other | Admitting: Internal Medicine

## 2014-02-05 ENCOUNTER — Encounter: Payer: Self-pay | Admitting: Internal Medicine

## 2014-02-05 DIAGNOSIS — K7460 Unspecified cirrhosis of liver: Secondary | ICD-10-CM

## 2014-02-05 DIAGNOSIS — B181 Chronic viral hepatitis B without delta-agent: Secondary | ICD-10-CM

## 2014-02-05 DIAGNOSIS — R188 Other ascites: Principal | ICD-10-CM

## 2014-02-05 DIAGNOSIS — Z710 Person encountering health services to consult on behalf of another person: Secondary | ICD-10-CM

## 2014-02-05 NOTE — Assessment & Plan Note (Addendum)
D/c baraclude so pt can go to [facility]

## 2014-02-05 NOTE — Progress Notes (Signed)
MRN: [id number] Name: Tyler Huang  Sex: male Age: 63 y.o. DOB: 1950-12-06  [place] #: Karren Burly Facility/Room: 128A Level Of Care: SNF Provider: Inocencio Homes D Emergency Contacts: Extended Emergency Contact Information Primary Emergency Contact: Kirven,Jan Address: [address]          [place], [place] [address] Johnnette Litter of [place] Phone: [phone] Relation: Sister Secondary Emergency Contact: White,Chris  [place] Mobile Phone: [phone] Relation: None  Code Status: DNR  Allergies: Ciprofloxacin  Chief Complaint  Patient presents with  . Medical Management of Chronic Issues    HPI: Patient is 63 y.o. male who has non alcoholic endstage liver dx has decided to become a hospice pt. His sister and the hospice nurse have conferenced with me and asked me to see him regarding several issues.  Past Medical History  Diagnosis Date  . Hepatitis B     non IVD  . Cirrhosis   . Cancer     CLL  . Sepsis 2015    No past surgical history on file.    Medication List       This list is accurate as of: 02/05/14 11:59 PM.  Always use your most recent med list.               entecavir 1 MG tablet  Commonly known as:  BARACLUDE  Take 1 mg by mouth daily.     furosemide 80 MG tablet  Commonly known as:  LASIX  Take 80 mg by mouth daily.     spironolactone 100 MG tablet  Commonly known as:  ALDACTONE  Take 200 mg by mouth daily.        No orders of the defined types were placed in this encounter.     There is no immunization history on file for this patient.  History  Substance Use Topics  . Smoking status: Former Smoker    Types: Cigarettes  . Smokeless tobacco: Never Used  . Alcohol Use: 0.5 oz/week    1 drink(s) per week    Family history is noncontributory    Review of Systems  DATA OBTAINED: from patient,sister GENERAL: very week SKIN: No itching, rash;skin tears EYES: No eye pain, redness, discharge EARS: No  earache, tinnitus, change in hearing NOSE: No congestion, drainage or bleeding  MOUTH/THROAT: No mouth or tooth pain, No sore throat  RESPIRATORY: No cough, wheezing, finally admits he is SOB;sister has says he sounds horrible CARDIAC: No chest pain, palpitations, lower extremity edema  GI: No abdominal pain, No N/V/D or constipation, No heartburn or reflux; + ascites GU: No dysuria, frequency or urgency, or incontinence  MUSCULOSKELETAL: No unrelieved bone/joint pain NEUROLOGIC: No headache, dizziness or focal weakness PSYCHIATRIC: Not overly anxious   Filed Vitals:   02/05/14 1745  BP: 93/83  Pulse: 98  Temp: 98.7 F (37.1 C)  Resp: 18    Physical Exam  GENERAL APPEARANCE: Alert, conversant. Appropriately groomed SKIN: No diaphoresis rash; dressing from skin shears from rolling over in bed. HEAD: Normocephalic, atraumatic  EYES: Conjunctiva/lids clear. Pupils round, reactive. EOMs intact.  EARS: External exam WNL, canals clear. Hearing grossly normal.  NOSE: No deformity or discharge.  MOUTH/THROAT: Lips w/o lesions RESPIRATORY: Breathing is even, unlabored. Lung sounds absent lower L lung  CARDIOVASCULAR: Heart RRR no murmurs, rubs or gallops. No peripheral edema.  GASTROINTESTINAL: Abdomen is soft, non-tender, +distended w/ normal bowel sounds GENITOURINARY: Bladder non tender, not distended  MUSCULOSKELETAL: wasting NEUROLOGIC: Oriented X3. Cranial nerves 2-12 grossly  intact PSYCHIATRIC: Mood and affect appropriate to situation, no behavioral issues  Patient Active Problem List   Diagnosis Date Noted  . Septic shock 02/17/2014  . Hypotension 02/23/2014  . End of life care 02/28/2014  . HCAP (healthcare-associated pneumonia) 01/15/2014  . Orthostatic hypotension 12/23/2013  . Cancer   . Hepatitis B   . Cirrhosis   . Protein-calorie malnutrition, severe 11/06/2013  . Bacteremia due to Gram-negative bacteria 11/05/2013  . Septic shock(785.52) 11/03/2013  . GI  hemorrhage 11/03/2013  . Coagulopathy 11/03/2013  . Pancytopenia 11/03/2013    CBC    Component Value Date/Time   WBC 3.6* 11/14/2013 0339   RBC 3.77* 11/14/2013 0339   HGB 12.0* 11/14/2013 0339   HCT 34.3* 11/14/2013 0339   PLT 52* 11/14/2013 0339   MCV 91.0 11/14/2013 0339   LYMPHSABS 0.2* 11/03/2013 0916   MONOABS 0.5 11/03/2013 0916   EOSABS 0.0 11/03/2013 0916   BASOSABS 0.0 11/03/2013 0916    CMP     Component Value Date/Time   NA 117* 02/18/2014 1348   K 5.7* 02/12/2014 1348   CL 77* 02/21/2014 1348   CO2 23 02/16/2014 1348   GLUCOSE 48* 02/09/2014 1348   BUN 62* 02/12/2014 1348   CREATININE 1.64* 02/22/2014 1348   CALCIUM 8.1* 02/20/2014 1348   PROT 4.1* 02/24/2014 1348   ALBUMIN 1.9* 02/14/2014 1348   AST 90* 02/05/2014 1348   ALT 35 02/09/2014 1348   ALKPHOS 117 02/05/2014 1348   BILITOT 2.8* 02/17/2014 1348   GFRNONAA 43* 02/06/2014 1348   GFRAA 49* 02/06/2014 1348    Assessment and Plan  FAMILY CONFERENCE WITHOUT PT PRESENT - sister and I spoke at length then hospice and I spoke also. Several issue-[facility] Also hospice nurse doesn't think he will be able to get paracentesis once he's there. Pt's sister says his breathing sounds awful, she thinks he has PNA and [facility] will not put him on an abx once he's there.  Hepatitis B  D/c baraclude so pt can go to [facility]  Cirrhosis Pt needs to get paracentesis before he goes to [place]. I think he has a L pleural effusion (later confirmed on CXR results) and paracentesis will make pt more comfortable. He's not stable enough for a lung tap. Nurses working on getting him set up ASAP. I've written for an abx for possible PNA, doubtful but peace of mind for sister. O2 has already been ordered and placed on pt.    Hennie Duos, MD

## 2014-02-05 NOTE — Assessment & Plan Note (Addendum)
Pt needs to get paracentesis before he goes to [place]. I think he has a L pleural effusion (later confirmed on CXR results) and paracentesis will make pt more comfortable. He's not stable enough for a lung tap. Nurses working on getting him set up ASAP. I've written for an abx for possible PNA, doubtful but peace of mind for sister. O2 has already been ordered and placed on pt.

## 2014-02-06 ENCOUNTER — Emergency Department (HOSPITAL_COMMUNITY)

## 2014-02-06 ENCOUNTER — Ambulatory Visit (HOSPITAL_COMMUNITY)
Admission: RE | Admit: 2014-02-06 | Discharge: 2014-02-06 | Disposition: A | Discharge status: Home / Self Care | Source: Ambulatory Visit | Attending: Internal Medicine | Admitting: Internal Medicine

## 2014-02-06 ENCOUNTER — Encounter (HOSPITAL_COMMUNITY): Payer: Self-pay | Admitting: Emergency Medicine

## 2014-02-06 ENCOUNTER — Other Ambulatory Visit: Payer: Self-pay | Admitting: Internal Medicine

## 2014-02-06 ENCOUNTER — Observation Stay (HOSPITAL_COMMUNITY)
Admission: EM | Admit: 2014-02-06 | Discharge: 2014-03-07 | Disposition: E | Attending: Internal Medicine | Admitting: Internal Medicine

## 2014-02-06 DIAGNOSIS — R188 Other ascites: Secondary | ICD-10-CM

## 2014-02-06 DIAGNOSIS — C9110 Chronic lymphocytic leukemia of B-cell type not having achieved remission: Secondary | ICD-10-CM | POA: Insufficient documentation

## 2014-02-06 DIAGNOSIS — E162 Hypoglycemia, unspecified: Secondary | ICD-10-CM

## 2014-02-06 DIAGNOSIS — B1910 Unspecified viral hepatitis B without hepatic coma: Secondary | ICD-10-CM | POA: Insufficient documentation

## 2014-02-06 DIAGNOSIS — Z881 Allergy status to other antibiotic agents status: Secondary | ICD-10-CM | POA: Insufficient documentation

## 2014-02-06 DIAGNOSIS — C9510 Chronic leukemia of unspecified cell type not having achieved remission: Secondary | ICD-10-CM

## 2014-02-06 DIAGNOSIS — C801 Malignant (primary) neoplasm, unspecified: Secondary | ICD-10-CM | POA: Diagnosis present

## 2014-02-06 DIAGNOSIS — R627 Adult failure to thrive: Secondary | ICD-10-CM | POA: Diagnosis not present

## 2014-02-06 DIAGNOSIS — Z923 Personal history of irradiation: Secondary | ICD-10-CM | POA: Insufficient documentation

## 2014-02-06 DIAGNOSIS — D61818 Other pancytopenia: Secondary | ICD-10-CM | POA: Diagnosis not present

## 2014-02-06 DIAGNOSIS — R6521 Severe sepsis with septic shock: Secondary | ICD-10-CM

## 2014-02-06 DIAGNOSIS — I959 Hypotension, unspecified: Secondary | ICD-10-CM | POA: Diagnosis present

## 2014-02-06 DIAGNOSIS — K7460 Unspecified cirrhosis of liver: Secondary | ICD-10-CM | POA: Diagnosis not present

## 2014-02-06 DIAGNOSIS — A419 Sepsis, unspecified organism: Secondary | ICD-10-CM | POA: Insufficient documentation

## 2014-02-06 DIAGNOSIS — E43 Unspecified severe protein-calorie malnutrition: Secondary | ICD-10-CM | POA: Insufficient documentation

## 2014-02-06 DIAGNOSIS — Z515 Encounter for palliative care: Secondary | ICD-10-CM

## 2014-02-06 HISTORY — DX: Sepsis, unspecified organism: A41.9

## 2014-02-06 LAB — COMPREHENSIVE METABOLIC PANEL
ALK PHOS: 117 U/L (ref 39–117)
ALT: 35 U/L (ref 0–53)
ANION GAP: 17 — AB (ref 5–15)
AST: 90 U/L — ABNORMAL HIGH (ref 0–37)
Albumin: 1.9 g/dL — ABNORMAL LOW (ref 3.5–5.2)
BUN: 62 mg/dL — AB (ref 6–23)
CHLORIDE: 77 meq/L — AB (ref 96–112)
CO2: 23 mEq/L (ref 19–32)
Calcium: 8.1 mg/dL — ABNORMAL LOW (ref 8.4–10.5)
Creatinine, Ser: 1.64 mg/dL — ABNORMAL HIGH (ref 0.50–1.35)
GFR, EST AFRICAN AMERICAN: 49 mL/min — AB (ref >90)
GFR, EST NON AFRICAN AMERICAN: 43 mL/min — AB (ref >90)
Glucose, Bld: 48 mg/dL — ABNORMAL LOW (ref 70–99)
Potassium: 5.7 mEq/L — ABNORMAL HIGH (ref 3.7–5.3)
Sodium: 117 mEq/L — CL (ref 137–147)
TOTAL PROTEIN: 4.1 g/dL — AB (ref 6.0–8.3)
Total Bilirubin: 2.8 mg/dL — ABNORMAL HIGH (ref 0.3–1.2)

## 2014-02-06 LAB — GLUCOSE, CAPILLARY
GLUCOSE-CAPILLARY: 55 mg/dL — AB (ref 70–99)
GLUCOSE-CAPILLARY: 94 mg/dL (ref 70–99)

## 2014-02-06 LAB — MRSA PCR SCREENING: MRSA by PCR: POSITIVE — AB

## 2014-02-06 MED ORDER — PIPERACILLIN-TAZOBACTAM 3.375 G IVPB 30 MIN
3.3750 g | Freq: Once | INTRAVENOUS | Status: DC
  Filled 2014-02-06: qty 50

## 2014-02-06 MED ORDER — ATROPINE SULFATE 1 % OP SOLN
4.0000 [drp] | OPHTHALMIC | Status: DC | PRN
  Filled 2014-02-06: qty 2

## 2014-02-06 MED ORDER — LORAZEPAM 2 MG/ML IJ SOLN
0.5000 mg | Freq: Four times a day (QID) | INTRAMUSCULAR | Status: DC | PRN
Start: 2014-02-06 — End: 2014-02-07

## 2014-02-06 MED ORDER — ENOXAPARIN SODIUM 40 MG/0.4ML ~~LOC~~ SOLN
40.0000 mg | SUBCUTANEOUS | Status: DC
  Filled 2014-02-06: qty 0.4

## 2014-02-06 MED ORDER — HYDROMORPHONE HCL PF 1 MG/ML IJ SOLN
1.0000 mg | INTRAMUSCULAR | Status: DC | PRN
  Administered 2014-02-06: 1 mg via INTRAVENOUS
  Filled 2014-02-06: qty 1

## 2014-02-06 MED ORDER — MORPHINE SULFATE 2 MG/ML IJ SOLN
1.0000 mg | INTRAMUSCULAR | Status: DC | PRN
Start: 2014-02-06 — End: 2014-02-07

## 2014-02-06 MED ORDER — LIDOCAINE HCL (PF) 1 % IJ SOLN
INTRAMUSCULAR | Status: AC
  Filled 2014-02-06: qty 10

## 2014-02-06 MED ORDER — VANCOMYCIN HCL IN DEXTROSE 1-5 GM/200ML-% IV SOLN
1000.0000 mg | Freq: Once | INTRAVENOUS | Status: DC
  Filled 2014-02-06: qty 200

## 2014-02-06 MED ORDER — MORPHINE SULFATE 2 MG/ML IJ SOLN
1.0000 mg | INTRAMUSCULAR | Status: DC | PRN
  Administered 2014-02-06: 1 mg via INTRAVENOUS
  Filled 2014-02-06: qty 1

## 2014-02-06 MED ORDER — DEXTROSE 50 % IV SOLN
50.0000 mL | Freq: Once | INTRAVENOUS | Status: AC | PRN
  Administered 2014-02-06: 50 mL via INTRAVENOUS

## 2014-02-06 MED ORDER — SODIUM CHLORIDE 0.9 % IV BOLUS (SEPSIS): 1000.0000 mL | Freq: Once | INTRAVENOUS | Status: DC

## 2014-02-06 NOTE — ED Notes (Signed)
Pt sister and nephew at bedside-- pt's wishes to be comfort care-- dr Colin Rhein notified

## 2014-02-06 NOTE — Progress Notes (Signed)
  CARE MANAGEMENT ED NOTE 03/06/2014  Patient:  Tyler Huang,Tyler Huang   Account Number:  [id number]  Date Initiated:  02/25/2014  Documentation initiated by:  Edwyna Shell  Subjective/Objective Assessment:   63 yo male presenting to the ED with hypotension     Subjective/Objective Assessment Detail:     Action/Plan:   Patient will be admitted for EOL care.   Action/Plan Detail:   Anticipated DC Date:       Status Recommendation to Physician:   Result of Recommendation:  Agreed    DC Planning Services  Other  CM consult    Choice offered to / List presented to:            Status of service:  Completed, signed off  ED Comments:   ED Comments Detail:  CM contacted by [facility] liaison Santiago Glad to assist with disposition. This CM spoke with Drucie Opitz, staff RN Santiago Glad, and EDP. The patient was admitted to [facility] services for EOL care on 9/1. The patient had a MOST form present and family goals and wishes were discussed with patient sister Tyler Huang [phone]. Tyler Huang stated that she understood the patient is at EOL and wants him to be comfortable and she feels that IV medications will just prolong the patient's life unnecessarily. The patient was unable to verbalize his wishes due to his rapidly declining condition. Tyler Huang stated that she was comfortable making the choice for EOL care only with no aggressive treatment. She stated that she just wants him to be kept comfortable. This CM updated EDP, Dr. Colin Rhein and staff RN Santiago Glad with sisters wishes. [facility] liaison also at bedside and aware of plan. No other CM needs identified at this time.

## 2014-02-06 NOTE — ED Notes (Signed)
Pt verbal-- will answer questions, has kling gauze wrapped around both arms, due to multiple skin tears. Pt's nephew here to see pt-- pt;s sister on the way,

## 2014-02-06 NOTE — ED Notes (Signed)
Pt's family requesting that pt be comfort care.  EDP made aware of family's wishes.  Antibiotics and fluids d/c.

## 2014-02-06 NOTE — H&P (Signed)
Date: 03/04/2014               Patient Name:  Tyler Huang MRN: [id number]  DOB: Mar 25, 1951 Age / Sex: 63 y.o., male   PCP: Gayland Curry, DO              Medical Service: Internal Medicine Teaching Service              Attending Physician: Dr. 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  This morning he was scheduled to have a therapeutic paracentesis, but was found to be hypotensive.  He was transferred to [facility], where he continued to have severe hypotension and became unresponsive.  There is some confusion over his recent state of health, but there is some thought by family members that recent CXR showed PNA.  In the past year he has been treated for HCAP, GI bleed, Klebsiella bacteremia.  He was most recently treated for HCAP with doxycycline on 01/15/14, but had been stable since this event with systolic BP in the 93T, normal mental status, and adequate appetite despite failure to thrive.  He was last seen in this normal state of health last night (9/1).  He also recently discontinued entecavir on 9/1 after deciding to enter hospice.  Meds: No current facility-administered medications for this encounter.   Current Outpatient Prescriptions  Medication Sig Dispense Refill  . furosemide (LASIX) 80 MG tablet Take 80 mg by mouth daily.       Marland Kitchen spironolactone (ALDACTONE) 100 MG tablet Take 200 mg by mouth daily.       Facility-Administered Medications Ordered in Other Encounters  Medication Dose Route Frequency Provider Last Rate Last Dose  . lidocaine (PF) (XYLOCAINE) 1 % injection             Allergies: Allergies as of 02/14/2014 - Review Complete 02/16/2014  Allergen Reaction Noted  . Ciprofloxacin Other (See Comments) 12/23/2013   Past Medical History  Diagnosis Date  .  Hepatitis B     non IVD  . Cirrhosis   . Cancer     CLL   History reviewed. No pertinent past surgical history. No family history on file. History   Social History  . Marital Status: Single    Spouse Name: N/A    Number of Children: N/A  . Years of Education: N/A   Occupational History  . Not on file.   Social History Main Topics  . Smoking status: Former Smoker    Types: Cigarettes  . Smokeless tobacco: Not on file  . Alcohol Use: 0.5 oz/week    1 drink(s) per week  . Drug Use: No  . Sexual Activity: Not on file   Other Topics Concern  . Not on file   Social History Narrative  . No narrative on file    Review of Systems: Review of systems not obtained due to patient factors.  Physical Exam: Blood pressure 55/22, pulse 111, temperature 97.4 F (36.3 C), temperature source Rectal, resp. rate 25, SpO2 100.00%. Filed Vitals:   03/04/2014 1327 02/11/2014 1457 02/19/2014 1500 02/25/2014 1515  BP: 50/31 49/32 51/38  55/22  Pulse: 112 53 107  111  Temp: 97.4 F (36.3 C)     TempSrc: Rectal     Resp: 40 29 23 25   SpO2:  100% 96% 100%   General: Vital signs reviewed.  Patient is unresponsive, but is able to grimace to painful stimuli Head: Normocephalic and atraumatic. Eyes: EOMI, conjunctivae normal, no scleral icterus.  Cardiovascular: heart sounds faint. Tachycardic.  No murmurs, rubs, or gallops. Pulmonary/Chest: breaths labored.  CTA, but difficult to appreciate. Abdominal: Soft, non-tender, non-distended, BS +, no masses, organomegaly, or guarding present.  Extremities: Bilateral pitting edema in LE.  Pulses not appreciable Neurological: A&O x0. Obtunded Skin: Skin brittle; multiple open sores.  Arms dressed.  Lab results: Basic Metabolic Panel:  Recent Labs  02/11/2014 1348  NA 117*  K 5.7*  CL 77*  CO2 23  GLUCOSE 48*  BUN 62*  CREATININE 1.64*  CALCIUM 8.1*   Liver Function Tests:  Recent Labs  02/05/2014 1348  AST 90*  ALT 35  ALKPHOS 117    BILITOT 2.8*  PROT 4.1*  ALBUMIN 1.9*   No results found for this basename: LIPASE, AMYLASE,  in the last 72 hours No results found for this basename: AMMONIA,  in the last 72 hours CBC: No results found for this basename: WBC, NEUTROABS, HGB, HCT, MCV, PLT,  in the last 72 hours Cardiac Enzymes: No results found for this basename: CKTOTAL, CKMB, CKMBINDEX, TROPONINI,  in the last 72 hours BNP: No results found for this basename: PROBNP,  in the last 72 hours D-Dimer: No results found for this basename: DDIMER,  in the last 72 hours CBG: No results found for this basename: GLUCAP,  in the last 72 hours Hemoglobin A1C: No results found for this basename: HGBA1C,  in the last 72 hours Fasting Lipid Panel: No results found for this basename: CHOL, HDL, LDLCALC, TRIG, CHOLHDL, LDLDIRECT,  in the last 72 hours Thyroid Function Tests: No results found for this basename: TSH, T4TOTAL, FREET4, T3FREE, THYROIDAB,  in the last 72 hours Anemia Panel: No results found for this basename: VITAMINB12, FOLATE, FERRITIN, TIBC, IRON, RETICCTPCT,  in the last 72 hours Coagulation: No results found for this basename: LABPROT, INR,  in the last 72 hours Urine Drug Screen: Drugs of Abuse  No results found for this basename: labopia, cocainscrnur, labbenz, amphetmu, thcu, labbarb    Alcohol Level: No results found for this basename: ETH,  in the last 72 hours Urinalysis: No results found for this basename: COLORURINE, APPERANCEUR, LABSPEC, PHURINE, GLUCOSEU, HGBUR, BILIRUBINUR, KETONESUR, PROTEINUR, UROBILINOGEN, NITRITE, LEUKOCYTESUR,  in the last 72 hours Misc. Labs:   Imaging results:  US Abdomen Limited  02/22/2014   CLINICAL DATA:  Ascites.  EXAM: LIMITED ABDOMEN ULTRASOUND FOR ASCITES  TECHNIQUE: Limited ultrasound survey for ascites was performed in all four abdominal quadrants.  COMPARISON:  11/12/2013.  FINDINGS: Small amount of ascites present. No other focal abnormality identified.   IMPRESSION: Small amount of ascites.   Electronically Signed   By: Marcello Moores  Register   On: 03/03/2014 13:27    Other results: EKG: normal EKG, normal sinus rhythm, unchanged from previous tracings.  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 He is currently obtunded with severe hypotension, and will likely die without intervention  Plan:  Hypotension/LOC:  After a long discussion with the family, Mr. Withey will be placed on comfort care.  Will no longer check labs and vitals. -  atropine 1% ophthalmic 4 drops sublingual q4h PRN for secretions - ativan .5 mg IV q 6hr PRN anxiety - morphine 1mg  IV q2hr PRN   This is a Careers information officer Note.  The care of the patient was discussed with Dr. Aundra Dubin and the assessment and plan was formulated with their assistance.  Please see their note for official documentation of the patient encounter.   Signed: Charise Killian, Med Student 02/18/2014, 3:54 PM

## 2014-02-06 NOTE — H&P (Signed)
Date: 02/14/2014               Patient Name:  Tyler Huang MRN: [id number]  DOB: 1950-09-19 Age / Sex: 63 y.o., male   PCP: Gayland Curry, DO         Medical Service: Internal Medicine Teaching Service         Attending Physician: Dr. Madilyn Fireman, MD    First Contact: MS4 Marjorie Smolder  Pager: [phone]  Second Contact: Dr. Aundra Dubin  Pager: [phone]       After Hours (After 5p/  First Contact Pager: [phone]  weekends / holidays): Second Contact Pager: [phone]   Chief Complaint: hypotension  History of Present Illness:  63 y.o PMH ESLD, gallstones, pancytopenia, failure to thrive, history of GIB and h/o septic shock and gram neg bacteremia with Klebsiella (11/03/13-11/14/13 in ICU at [facility]) tx'ed with antibiotics, recent pneumonia 01/2014 dx with HAP tx'ed with Doxycycline outpatient by [facility] MD's, history of CLL s/p radiation, hepatitis B was on Entecavir but recently stopped 02/05/14.  He was being treated at [facility] for hepatitis but was found not to be a transplant candidate.  He presents due to hypotension noted today. Family does not want pressors. He was last seen normal yesterday at 8 PM in [facility] and today is more lethargic.  He presented today for palliative paracentesis but when arrived BP was 49/32-55/22 so the decision to abort the procedure was decided.   He was awaiting outpatient hospice facility at [facility] or [facility].  Patient is DNR family is thinking of comfort care and establishing POA. Sister Elouise Munroe [phone]) is closest relative though pt has girlfriend of several years Debbie.    Meds: Medications Prior to Admission  Medication Sig Dispense Refill  . furosemide (LASIX) 80 MG tablet Take 80 mg by mouth daily.       Marland Kitchen spironolactone (ALDACTONE) 100 MG tablet Take 200 mg by mouth daily.       Current Facility-Administered Medications  Medication Dose Route Frequency Provider Last Rate Last Dose  . dextrose 50 %  solution 50 mL  50 mL Intravenous Once PRN Cresenciano Genre, MD      . morphine 2 MG/ML injection 1 mg  1 mg Intravenous Q4H PRN Cresenciano Genre, MD       Facility-Administered Medications Ordered in Other Encounters  Medication Dose Route Frequency Provider Last Rate Last Dose  . lidocaine (PF) (XYLOCAINE) 1 % injection             Allergies: Allergies as of 02/26/2014 - Review Complete 02/17/2014  Allergen Reaction Noted  . Ciprofloxacin Other (See Comments) 12/23/2013   Past Medical History  Diagnosis Date  . Hepatitis B     non IVD  . Cirrhosis   . Cancer     CLL  . Sepsis 2015   History reviewed. No pertinent past surgical history. History reviewed. No pertinent family history. History   Social History  . Marital Status: Single    Spouse Name: N/A    Number of Children: N/A  . Years of Education: N/A   Occupational History  . Not on file.   Social History Main Topics  . Smoking status: Former Smoker    Types: Cigarettes  . Smokeless tobacco: Never Used  . Alcohol Use: 0.5 oz/week    1 drink(s) per week  . Drug Use: No  . Sexual Activity: Not on file   Other  Topics Concern  . Not on file   Social History Narrative  . No narrative on file    Review of Systems: Unable to obtain due to patient mental status    Physical Exam: Blood pressure 55/22, pulse 111, temperature 97.4 F (36.3 C), temperature source Rectal, resp. rate 25, SpO2 100.00%. Vitals reviewed. General: resting in bed, NAD, eyes closed, cachetic HEENT: [place]/at, eyes closed  Cardiac: slightly tachycardic, no rubs, murmurs or gallops Pulm: clear to auscultation bilaterally anteriorly Abd: soft, nontender, nondistended, BS present Ext: cyanotic toes, 1+ pedal edema b/l Neuro: eyes closed, grimacing to pain, not following command Skin: multiple skin wounds and atrophy to skin    Lab results: Basic Metabolic Panel:  Recent Labs  02/13/2014 1348  NA 117*  K 5.7*  CL 77*  CO2 23  GLUCOSE  48*  BUN 62*  CREATININE 1.64*  CALCIUM 8.1*   Liver Function Tests:  Recent Labs  02/20/2014 1348  AST 90*  ALT 35  ALKPHOS 117  BILITOT 2.8*  PROT 4.1*  ALBUMIN 1.9*   CBG:  Recent Labs  02/09/2014 1740  GLUCAP 16*    Misc. Labs:   Imaging results:  US Abdomen Limited  02/27/2014   CLINICAL DATA:  Ascites.  EXAM: LIMITED ABDOMEN ULTRASOUND FOR ASCITES  TECHNIQUE: Limited ultrasound survey for ascites was performed in all four abdominal quadrants.  COMPARISON:  11/12/2013.  FINDINGS: Small amount of ascites present. No other focal abnormality identified.  IMPRESSION: Small amount of ascites.   Electronically Signed   By: Marcello Moores  Register   On: 02/13/2014 13:27    Other results: none Assessment & Plan by Problem: 63 y.o man presented to the ED with severe hypotension approaching end of life for multiple reasons stated previously.   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, h/o CLL s/p radiation  -He was getting palliative paracentesis regularly and came for paracentesis today and found to be severely hypotensive with hyponatremia, hypokalemia, AKI,  -not a candidate for liver transplant will stop Lasix and Spironlactone and recently stopped antiretroviral for hepatitis  -morphine 1 mg q2 prn, Atropine SL 4 drops q4 prn under tongue, Ativan 0.5 q6 prn  -no labs, no pressors or further treatment measures per family request will make comfortable -Can call palliative care/hospice if needed overnight  #Severe Hypotension -Per family his BP has been sbp in the 60s for a while.  Etiology may be secondary to sepsis but will not pursue further w/u as patient is end of life  -will not do pressors  -will not monitor vitals comfort care only   #Severe malnutrition   #Hypoglycemia  -given D50 x 1   #F/E/N -no IVF -no labs, electrolytes abnormal but will not follow  - NPO  Dispo: end of life at some point soon unfortunately  The patient does have a current PCP ([place], DO) and does not need an [facility] hospital follow-up appointment after discharge.  The patient does not have transportation limitations that hinder transportation to clinic appointments.  Signed: Cresenciano Genre, MD [phone] 03/01/2014, 5:45 PM

## 2014-02-06 NOTE — ED Notes (Signed)
Pt transferred from outpatient radiology to trauma C-- was scheduled for a paracentesis this am, brought to hospital via [place] from [facility]-- while in radiology, has been hypotensive, unable to complete paracentesis. [facility] was notified to transport pt back to nursing home, [facility] staff brought pt to ED due to low BP-- BP on arrival to ED 42/26.pt will respond appropriately to verbal stimuli.

## 2014-02-06 NOTE — Progress Notes (Addendum)
ED RN visit-[facility] Shar[facility] ED [facility])-[name] [place] RN  Notified by [facility] RN Boris Lown that pt was taken to the ED from his planned paracentesis procedure. Paracentesis not performed d/t low BP, transport took pt to the ED for assessment, rather than taking him back to his SNF. Pt seen at bedside in [facility] room C. Pt appeared cachectic, with decreased responsiveness, increased HR and RR and BP of 53/32.  Several skin tears present and odor noted from wrapped wounds to his arms. Pt admitted to [facility] services on 9/1 with plan to transfer to [facility] when bed available. No bed available at this time. Writer advsd ED CMRN Seth Bake, who spoke with pt's sister Jan over the phone and nephew Cheri Rous who was present, regarding pt's poor status/prognosis. Family chose to keep pt in the hospital for EOL care and not to look for another inpatient facility. Jan, arrived  at bedside, much discussion/education regarding comfort care in the hospital. Jan in full agreement with keeping pt comfortable and voiced understanding of his current end of life stage. Emotional support offered. Jan advised that [facility] would follow daily.   Flo Shanks RN, BSN, [facility] Liaison Please call [phone] with any hospice needs.

## 2014-02-06 NOTE — ED Notes (Signed)
Dr Colin Rhein spoke with family regarding palliative care, hospice nurse notified, case manager notified.

## 2014-02-06 NOTE — ED Provider Notes (Signed)
CSN: [id number]     Arrival date & time 02/20/2014  1315 History   First MD Initiated Contact with Patient 02/22/2014 1328     Chief Complaint  Patient presents with  . hypotensive      (Consider location/radiation/quality/duration/timing/severity/associated sxs/prior Treatment) Patient is a 63 y.o. male presenting with general illness.  Illness Location:  Generalized Quality:  Weakness, abd pain Severity:  Moderate Onset quality:  Gradual Timing:  Constant Progression:  Worsening Chronicity:  Chronic Context:  Ho Hep B, on hospice Relieved by:  Nothing Worsened by:  Nothing Associated symptoms: abdominal pain, cough and shortness of breath   Associated symptoms: no chest pain, no loss of consciousness, no nausea and no vomiting     Past Medical History  Diagnosis Date  . Hepatitis B     non IVD  . Cirrhosis   . Cancer     CLL  . Sepsis 2015   History reviewed. No pertinent past surgical history. History reviewed. No pertinent family history. History  Substance Use Topics  . Smoking status: Former Smoker    Types: Cigarettes  . Smokeless tobacco: Never Used  . Alcohol Use: 0.5 oz/week    1 drink(s) per week    Review of Systems  Unable to perform ROS: Acuity of condition  Respiratory: Positive for cough and shortness of breath.   Cardiovascular: Negative for chest pain.  Gastrointestinal: Positive for abdominal pain. Negative for nausea and vomiting.  Neurological: Negative for loss of consciousness.      Allergies  Ciprofloxacin  Home Medications   Prior to Admission medications   Medication Sig Start Date End Date Taking? Authorizing Provider  furosemide (LASIX) 80 MG tablet Take 80 mg by mouth daily.    Yes Historical Provider, MD  spironolactone (ALDACTONE) 100 MG tablet Take 200 mg by mouth daily.   Yes Historical Provider, MD   BP 55/22  Pulse 111  Temp(Src) 97.4 F (36.3 C) (Rectal)  Resp 25  SpO2 100% Physical Exam  Vitals  reviewed. Constitutional: He is oriented to person, place, and time. He appears cachectic.  HENT:  Head: Normocephalic and atraumatic.  Eyes: Conjunctivae and EOM are normal.  Neck: Normal range of motion. Neck supple.  Cardiovascular: Normal rate, regular rhythm and normal heart sounds.   Pulmonary/Chest: Effort normal and breath sounds normal. No respiratory distress.  Abdominal: He exhibits distension. There is no tenderness. There is no rebound and no guarding.  Musculoskeletal: Normal range of motion.  Neurological: He is alert and oriented to person, place, and time.  Skin: Skin is warm and dry.    ED Course  Procedures (including critical care time) Labs Review Labs Reviewed  MRSA PCR SCREENING - Abnormal; Notable for the following:    MRSA by PCR POSITIVE (*)    All other components within normal limits  COMPREHENSIVE METABOLIC PANEL - Abnormal; Notable for the following:    Sodium 117 (*)    Potassium 5.7 (*)    Chloride 77 (*)    Glucose, Bld 48 (*)    BUN 62 (*)    Creatinine, Ser 1.64 (*)    Calcium 8.1 (*)    Total Protein 4.1 (*)    Albumin 1.9 (*)    AST 90 (*)    Total Bilirubin 2.8 (*)    GFR calc non Af Amer 43 (*)    GFR calc Af Amer 49 (*)    Anion gap 17 (*)    All other components within normal  limits  GLUCOSE, CAPILLARY - Abnormal; Notable for the following:    Glucose-Capillary 55 (*)    All other components within normal limits  GLUCOSE, CAPILLARY  CBC WITH DIFFERENTIAL    Imaging Review US Abdomen Limited  02/13/2014   CLINICAL DATA:  Ascites.  EXAM: LIMITED ABDOMEN ULTRASOUND FOR ASCITES  TECHNIQUE: Limited ultrasound survey for ascites was performed in all four abdominal quadrants.  COMPARISON:  11/12/2013.  FINDINGS: Small amount of ascites present. No other focal abnormality identified.  IMPRESSION: Small amount of ascites.   Electronically Signed   By: Marcello Moores  Register   On: 02/26/2014 13:27     EKG Interpretation None      MDM    Final diagnoses:  Cirrhosis of liver with ascites, unspecified hepatic cirrhosis type  End of life care  Hepatitis B virus infection, unspecified chronicity  Hypotension, unspecified hypotension type  Protein-calorie malnutrition, severe    63 y.o. male with pertinent PMH of Hep B, cirrhosis, recently placed in hospice with pending placement to inpatient facility presents with hypotension.  Patient was to go for therapeutic paracentesis, however there was found to be hypotensive to 70 systolic. They aborted the procedure, and called [facility] . On the patient had systolic blood pressures in the 50s so they diverted from return to this facility and brought him to the emergency department on arrival the patient was then, cachectic, ill-appearing. His blood pressure was 42/26.  Pt had a DNR form on arrival.  I spoke with both the number and hospice staff who acknowledged that the patient was actively dying in their pursuing inpatient hospice therapy, however no beds be available for at least 24 hours.  I specifically discussed use of IV pressor medication for the patient's blood pressure and likely outcome, we shared decision-making we decided to defer placement of a central line and pressors given poor prognosis.  The patient was in agreement. Patient was admitted to the floor pending hospice placement.    Labs and imaging as above reviewed.   1. Cirrhosis of liver with ascites, unspecified hepatic cirrhosis type   2. End of life care   3. Hepatitis B virus infection, unspecified chronicity   4. Hypotension, unspecified hypotension type   5. Protein-calorie malnutrition, severe         Debby Freiberg, MD 03/01/14 [phone]

## 2014-02-06 NOTE — Progress Notes (Addendum)
Tyler Huang [id number] Admission Data: 02/25/2014 7:06 PM Attending Provider: Madilyn Fireman, MD  TYO:MAYO, TIFFANY, DO Consults/ Treatment Team:    Tyler Huang is a 63 y.o. male patient admitted from ED, responds to pain, verbal at times.  DNR, VSS - Blood pressure 55/22, pulse 111, temperature 97.4 F (36.3 C), temperature source Rectal, resp. rate 25, SpO2 100.00%.,     Allergies:   Allergies  Allergen Reactions  . Ciprofloxacin Other (See Comments)    pancytopenia     Past Medical History  Diagnosis Date  . Hepatitis B     non IVD  . Cirrhosis   . Cancer     CLL  . Sepsis 2015      INP armband ID verified with patient/family, and in place. SR up x 2, fall risk assessment complete with Patient and family verbalizing understanding of risks associated with falls. Multiple skin tears and wounds noted to arms and back. Two skin tears noted from tech while removing tele leads upon arrival to the floor. Skin tears cleaned and covered with dry protective dressing. Multiple bruising noted to body, generalized. Family did not want me to measure/tend to wounds on the arms and back. Charge nurse aware, wounds to be monitored.     Will cont to monitor and assist as needed.  Dayle Points, RN 02/06/2014 7:06 PM

## 2014-02-09 ENCOUNTER — Encounter: Payer: Self-pay | Admitting: Internal Medicine

## 2014-03-07 NOTE — H&P (Signed)
Agree with medical student Sonny Dandy note see my note for more details.   Aundra Dubin MD

## 2014-03-07 NOTE — H&P (Signed)
The patient had died before I could see him. He was already made comfort care, and the demise was expected. Death note by Dr Aundra Dubin.   Madilyn Fireman MD MPH Feb 19, 2014 3:49 PM

## 2014-03-07 NOTE — Progress Notes (Signed)
Inpatient RN visit- Tyler Huang [facility] 5 W Room 17-[facility] RN Visit-[name] Highland RN  Related admission to [facility] diagnosis of Chronic Liver Disease.  Pt is DNR  code.   In to see patient, patient died at 8:44am per staff RN [name]. His sister had already left the hospital. [facility] team notified of death.  Patient's home medication list and transfer summary in place on shadow chart.   Thank you. [name], RN  [facility]  Hospice Liaison  [phone])

## 2014-03-07 NOTE — Progress Notes (Signed)
UR Completed.  [phone]  

## 2014-03-07 NOTE — Progress Notes (Signed)
Agree with medical student Sonny Dandy note see my note for more details.   Aundra Dubin MD

## 2014-03-07 NOTE — Discharge Summary (Signed)
[facility] death note  Name: Tyler Huang MRN: [id number] DOB: 12-May-1951 63 y.o.  Date of Admission: 02/23/2014  1:15 PM Date of Discharge: 03-08-2014 Attending Physician: Madilyn Fireman, MD  Discharge Diagnosis: Principal Problem:   Hypotension Active Problems:   Protein-calorie malnutrition, severe   Cirrhosis   Cancer   Hepatitis B   End of life care   Cause of death: Liver Failure Time of death: 8:34am  Disposition and follow-up:   [name] was discharged from [facility] in expired condition.    Hospital Course: 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  Over the course of a few hours, he became non-responsive.  In light of his recent decision to enter hospice, discussion with the family determined that the goal of treatment would be comfort care.  Palliative care was consulted, and comfort care measures were put in place.  [name] had a peaceful night and died on the morning of 2014/03/08 with family members present at the bedside.   Signed: Charise Killian 2014-03-08, 10:52 AM

## 2014-03-07 NOTE — Progress Notes (Signed)
Subjective: Pt died this am.   Objective: Vital signs in last 24 hours: Filed Vitals:   02/26/2014 1327 02/11/2014 1457 02/14/2014 1500 02/20/2014 1515  BP: 50/31 49/32 51/38  55/22  Pulse: 112 53 107 111  Temp: 97.4 F (36.3 C)     TempSrc: Rectal     Resp: 40 29 23 25   SpO2:  100% 96% 100%   Weight change:  No intake or output data in the 24 hours ending February 14, 2014 0815 Vitals reviewed. General: lying in bed HEENT: eyes closed, no corneal reflex, no gag reflex  Cardiac: not examined, no heartbeat  Pulm: not examined Ext: not examined    Lab Results: Basic Metabolic Panel:  Recent Labs Lab 02/15/2014 1348  NA 117*  K 5.7*  CL 77*  CO2 23  GLUCOSE 48*  BUN 62*  CREATININE 1.64*  CALCIUM 8.1*   Liver Function Tests:  Recent Labs Lab 02/12/2014 1348  AST 90*  ALT 35  ALKPHOS 117  BILITOT 2.8*  PROT 4.1*  ALBUMIN 1.9*   CBG:  Recent Labs Lab 02/20/2014 1740 02/18/2014 1920  GLUCAP 55* 94   Micro Results: Recent Results (from the past 240 hour(s))  MRSA PCR SCREENING     Status: Abnormal   Collection Time    02/27/2014  5:12 PM      Result Value Ref Range Status   MRSA by PCR POSITIVE (*) NEGATIVE Final   Comment:            The GeneXpert MRSA Assay (FDA     approved for NASAL specimens     only), is one component of a     comprehensive MRSA colonization     surveillance program. It is not     intended to diagnose MRSA     infection nor to guide or     monitor treatment for     MRSA infections.     RESULT CALLED TO, READ BACK BY AND VERIFIED WITH:     Junius Roads RN 1850 02/27/2014 A BROWNING   Studies/Results: US Abdomen Limited  03/01/2014   CLINICAL DATA:  Ascites.  EXAM: LIMITED ABDOMEN ULTRASOUND FOR ASCITES  TECHNIQUE: Limited ultrasound survey for ascites was performed in all four abdominal quadrants.  COMPARISON:  11/12/2013.  FINDINGS: Small amount of ascites present. No other focal abnormality identified.  IMPRESSION: Small amount of ascites.    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, h/o CLL s/p radiation  -He was getting palliative paracentesis regularly and came for paracentesis today and found to be severely hypotensive with hyponatremia, hypokalemia, AKI,  -not a candidate for liver transplant will stop Lasix and Spironlactone and recently stopped antiretroviral for hepatitis  -morphine 1 mg q2 prn, Atropine SL 4 drops q4 prn under tongue, Ativan 0.5 q6 prn  -no labs, no pressors or further treatment measures per family request will make comfortable  -Can call palliative care/hospice if needed overnight   #Severe Hypotension  -will not do pressors  -will not monitor vitals comfort care only  #Severe malnutrition   #F/E/N  -no IVF  -no labs, electrolytes abnormal but will not follow  - NPO    Dispo: died  today   The patient does have a current PCP (Tiffany L Reed, DO) and does not need an [facility] hospital follow-up appointment after discharge.  The patient does not have transportation limitations that hinder transportation to clinic appointments.       LOS: 1 day   Cresenciano Genre, MD 2014-03-01, 8:15 AM

## 2014-03-07 NOTE — Progress Notes (Signed)
Subjective: Tyler Huang had a peaceful night and died around 8:30am.  The family reports that he did not suffer.  Objective: Vital signs in last 24 hours: Filed Vitals:   02/18/2014 1327 02/05/2014 1457 03/01/2014 1500 03/03/2014 1515  BP: 50/31 49/32 51/38  55/22  Pulse: 112 53 107 111  Temp: 97.4 F (36.3 C)     TempSrc: Rectal     Resp: 40 29 23 25   SpO2:  100% 96% 100%   Weight change:  No intake or output data in the 24 hours ending 2014/02/13 0911 No exam performed today, patient deceased. Lab Results: Basic Metabolic Panel:  Recent Labs Lab 02/22/2014 1348  NA 117*  K 5.7*  CL 77*  CO2 23  GLUCOSE 48*  BUN 62*  CREATININE 1.64*  CALCIUM 8.1*   Liver Function Tests:  Recent Labs Lab 02/14/2014 1348  AST 90*  ALT 35  ALKPHOS 117  BILITOT 2.8*  PROT 4.1*  ALBUMIN 1.9*   No results found for this basename: LIPASE, AMYLASE,  in the last 168 hours No results found for this basename: AMMONIA,  in the last 168 hours CBC: No results found for this basename: WBC, NEUTROABS, HGB, HCT, MCV, PLT,  in the last 168 hours Cardiac Enzymes: No results found for this basename: CKTOTAL, CKMB, CKMBINDEX, TROPONINI,  in the last 168 hours BNP: No results found for this basename: PROBNP,  in the last 168 hours D-Dimer: No results found for this basename: DDIMER,  in the last 168 hours CBG:  Recent Labs Lab 02/22/2014 1740 02/08/2014 1920  GLUCAP 55* 94   Hemoglobin A1C: No results found for this basename: HGBA1C,  in the last 168 hours Fasting Lipid Panel: No results found for this basename: CHOL, HDL, LDLCALC, TRIG, CHOLHDL, LDLDIRECT,  in the last 168 hours Thyroid Function Tests: No results found for this basename: TSH, T4TOTAL, FREET4, T3FREE, THYROIDAB,  in the last 168 hours Coagulation: No results found for this basename: LABPROT, INR,  in the last 168 hours Anemia Panel: No results found for this basename: VITAMINB12, FOLATE, FERRITIN, TIBC, IRON, RETICCTPCT,  in the  last 168 hours Urine Drug Screen: Drugs of Abuse  No results found for this basename: labopia, cocainscrnur, labbenz, amphetmu, thcu, labbarb    Alcohol Level: No results found for this basename: ETH,  in the last 168 hours Urinalysis: No results found for this basename: COLORURINE, APPERANCEUR, LABSPEC, PHURINE, GLUCOSEU, HGBUR, BILIRUBINUR, KETONESUR, PROTEINUR, UROBILINOGEN, NITRITE, LEUKOCYTESUR,  in the last 168 hours Misc. Labs:   Micro Results: Recent Results (from the past 240 hour(s))  MRSA PCR SCREENING     Status: Abnormal   Collection Time    03/06/2014  5:12 PM      Result Value Ref Range Status   MRSA by PCR POSITIVE (*) NEGATIVE Final   Comment:            The GeneXpert MRSA Assay (FDA     approved for NASAL specimens     only), is one component of a     comprehensive MRSA colonization     surveillance program. It is not     intended to diagnose MRSA     infection nor to guide or     monitor treatment for     MRSA infections.     RESULT CALLED TO, READ BACK BY AND VERIFIED WITH:     Junius Roads RN 1850 03/02/2014 A BROWNING   Studies/Results: US Abdomen Limited  02/23/2014   CLINICAL  DATA:  Ascites.  EXAM: LIMITED ABDOMEN ULTRASOUND FOR ASCITES  TECHNIQUE: Limited ultrasound survey for ascites was performed in all four abdominal quadrants.  COMPARISON:  11/12/2013.  FINDINGS: Small amount of ascites present. No other focal abnormality identified.  IMPRESSION: Small amount of ascites.   Electronically Signed   By: Marcello Moores  Register   On: 02/25/2014 13:27   Medications: I have reviewed the patient's current medications. Scheduled Meds:  Continuous Infusions:  PRN Meds:.atropine, LORazepam, morphine injection Assessment/Plan: Principal Problem:   Hypotension Active Problems:   Protein-calorie malnutrition, severe   Cirrhosis   Cancer   Hepatitis B   End of life care  End of Life Care:  Tyler Huang died peacefully in his sleep around 8:30am.    This is a Location manager Note.  The care of the patient was discussed with Dr. Aundra Dubin and the assessment and plan formulated with their assistance.  Please see their attached note for official documentation of the daily encounter.   LOS: 1 day   Charise Killian, Med Student March 02, 2014, 9:11 AM

## 2014-03-07 NOTE — Discharge Summary (Signed)
Agree with medical student Sonny Dandy note see my note for more details.   Aundra Dubin MD

## 2014-03-07 NOTE — Discharge Summary (Signed)
  Name: Tyler Huang MRN: [id number] DOB: [birth year] 63 y.o.  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   Protein-calorie malnutrition, severe  Cause of death: Severe hypotension, End stage liver disease secondary to hepatitis with ascites, CLL status post radiation.    Time of death: 8:44 am  Disposition and follow-up:   Mr.Tyler Huang was discharged from [facility] in expired condition.    Hospital Course: Chief Complaint: hypotension  History of Present Illness:  63 y.o PMH severe malnutrition, ESLD, gallstones, pancytopenia, failure to thrive, history of GIB and septic shock and gram negative bacteremia with Klebsiella (11/03/13-11/14/13 in ICU at [facility]) tx'ed with antibiotics, recent pneumonia 01/2014 with HAP treated with Doxycycline outpatient by [facility], history of CLL status post radiation, hepatitis B was on Entecavir but recently stopped 02/05/14. He was being treated at [facility] for hepatitis but was found not to be a transplant candidate.   He presented due to needing a paracentesis regularly due to end stage liver disease with recurrent ascites but procedure was aborted due to hypotension (BP 49/32-55/22) etiology could be secondary to organ failure versus sepsis though sepsis work up not pursued, with hyponatremia, hypokalemia, acute kidney injury noted 02/17/2014. Family did not want pressors. He was last seen normal 02/05/14 at 8 PM in [place] and 02/05/14 was more lethargic. He was awaiting outpatient hospice facility at [facility] or [facility] but there were no available beds. Patient was DNR family though family changed to comfort care (without labs or blood draws or further home medications) sister Tyler Huang [phone]) is closest  relative though patient had girlfriend of several years Tyler Huang.    Unfortunately, he was approaching end of life for multiple reasons stated previously and died.  He was made comfortable per palliative care recommendations with morphine 1 mg q2 prn, Atropine SL 4 drops q4 prn under tongue, Ativan 0.5 q6 prn.       Signed: Cresenciano Genre, MD 02-28-14, 9:39 AM

## 2014-03-07 DEATH — deceased

## 2014-05-22 ENCOUNTER — Encounter: Payer: Self-pay | Admitting: Internal Medicine

## 2014-05-22 DIAGNOSIS — C9110 Chronic lymphocytic leukemia of B-cell type not having achieved remission: Secondary | ICD-10-CM | POA: Insufficient documentation

## 2014-05-28 ENCOUNTER — Encounter: Payer: Self-pay | Admitting: Internal Medicine

## 2014-05-28 NOTE — Progress Notes (Signed)
Patient ID: Tyler Huang, male   DOB: 05/22/1951, 63 y.o.   MRN: [id number]  Location:  [facility] Provider:  Rexene Edison. Mariea Clonts, D.O., C.M.D.  Chief Complaint  Patient presents with  . 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  He is not eating or drinking well.  His ascites is recurring.  He has not accepted his prognosis b/c he was told by one of the specialists that treating his hepatitis was going to take care of the problem.  His MELD score is now 12, it was 17 at the hospital.  Average is 20 for a liver transplant. He has pancytopenia.  He's hyponatremic and his renal function is going to deteriorate due to his poor intake.  His sister is understanding of his poor prognosis, but he is not accepting himself.  We discussed the idea of hospice care and she is going to talk with him further about it.  He has great difficulty participating in therapy due to weakness. Review of Systems:  Review of Systems  Constitutional: Positive for weight loss and malaise/fatigue. Negative for fever.  HENT: Negative for congestion.   Eyes: Negative for blurred vision.  Respiratory: Negative for shortness of breath.   Cardiovascular: Negative for chest pain and leg swelling.  Gastrointestinal: Positive for abdominal pain. Negative for constipation, blood in stool and melena.  Genitourinary: Negative for dysuria.  Musculoskeletal: Positive for falls.  Skin: Negative for rash.  Neurological: Positive for dizziness and weakness.  Psychiatric/Behavioral: Positive for depression. Negative for suicidal ideas.    Medications: Patient's Medications  New Prescriptions   No medications on file  Previous Medications   FUROSEMIDE (LASIX) 80 MG TABLET     Take 80 mg by mouth daily.    SPIRONOLACTONE (ALDACTONE) 100 MG TABLET    Take 200 mg by mouth daily.  Modified Medications   No medications on file  Discontinued Medications   ENTECAVIR (BARACLUDE) 1 MG TABLET    Take 1 mg by mouth daily.    Physical Exam: Filed Vitals:   01/02/14 1200  BP: 103/64  Pulse: 92  Temp: 98.4 F (36.9 C)  Resp: 17  Height: 6' (1.829 m)  Weight: 119 lb (53.978 kg)  SpO2: 98%  Physical Exam  Constitutional: He is oriented to person, place, and time.  Cachectic white male with numerous bruises due to low platelets; must use wheelchair to get around and has difficulty doing so  Eyes: Scleral icterus is present.  Cardiovascular: Normal rate, regular rhythm, normal heart sounds and intact distal pulses.   Pulmonary/Chest: Effort normal and breath sounds normal. No respiratory distress.  Abdominal: Bowel sounds are normal. He exhibits distension. He exhibits no mass. There is tenderness.  ascites  Musculoskeletal: Normal range of motion.  Neurological: He is alert and oriented to person, place, and time.  Psychiatric:  Flat affect     Labs reviewed: Basic Metabolic Panel:  Recent Labs  11/07/13 0400 11/08/13 0433 11/09/13 0525  11/13/13 2051 11/14/13 0339 02/21/2014 1348  NA 130* 131* 134*  < > 132* 131* 117*  K 3.8 3.9 3.6*  < > 3.9 3.9 5.7*  CL 96 97 97  < > 97  98 77*  CO2 24 25 24   < > 23 23 23   GLUCOSE 124* 94 105*  < > 121* 92 48*  BUN 28* 27* 24*  < > 23 23 62*  CREATININE 0.94 0.84 0.83  < > 0.75 0.71 1.64*  CALCIUM 7.5* 7.4* 7.6*  < > 7.6* 7.5* 8.1*  MG 1.9 1.8 1.8  --  1.6  --   --   PHOS 2.7 2.1* 2.6  --   --   --   --   < > = values in this interval not displayed.  Liver Function Tests:  Recent Labs  11/13/13 0420 11/13/13 2051 03/04/2014 1348  AST 26 31 90*  ALT 16 16 35  ALKPHOS 70 68 117  BILITOT 2.4* 2.9* 2.8*  PROT 3.6* 3.7* 4.1*  ALBUMIN 2.0* 2.4* 1.9*    CBC:  Recent Labs  11/03/13 0916  11/13/13 0420  11/13/13 2051 11/14/13 0339  WBC 2.1*  < > 4.1 4.4 3.6*  NEUTROABS 1.4*  --   --   --   --   HGB 15.8  < > 12.3* 12.4* 12.0*  HCT 45.5  < > 34.7* 35.8* 34.3*  MCV 91.5  < > 91.6 91.1 91.0  PLT 102*  < > 49* 55* 52*  < > = values in this interval not displayed. Labs reviewed:  12/19/13:  Cbc with pancytopenia, bmp WITH nA 128, bun 25, 12/25/13:  pancytopenia Assessment/Plan 1. Hepatic cirrhosis, unspecified hepatic cirrhosis type -end stage -not tolerating his therapy well -declining--not eating well -has GI f/u due to recurrent ascites for drainage -his sister understands that hospice would be good for him and is trying to convince him for his qol  2. Pancytopenia -worsening with time, due to #1  3. Orthostatic hypotension -interferes with therapy and independence, but cannot lower diuretics more b/c he is already reaccumulating fluid  4. 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 Protein-calorie malnutrition, severe -due to his end stage cirrhosis, recent sepsis and prior CLL s/p treatment -cont supplements as best he will tolerate and encourage po intake  6. Chronic lymphocytic leukemia -not in acute stage, but had been treated previously with chemo   Family/ staff Communication: spoke with his sister  Labs/tests ordered:  Repeat MELD labs in 1 month if he survives; cont to encourage hospice for his qol

## 2014-07-06 IMAGING — US US PARACENTESIS
1 series · 5 of 5 positions shown · non-contrast
Comparison: None.

CLINICAL DATA: Hepatitis, large volume ascites. Request therapeutic
paracentesis of up to 4 L max.

EXAM:
ULTRASOUND GUIDED PARACENTESIS

[Series 1: us paracentesis · 0.28mm/px · 5 of 5 slices shown]
[im 1/5]
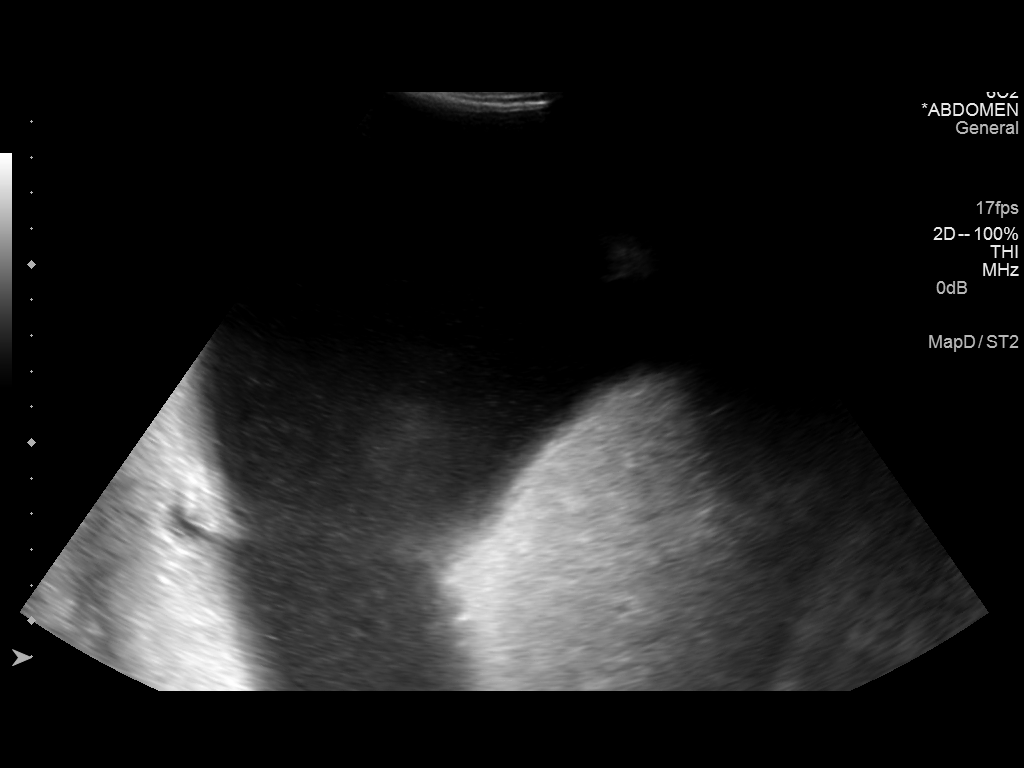
[im 2/5]
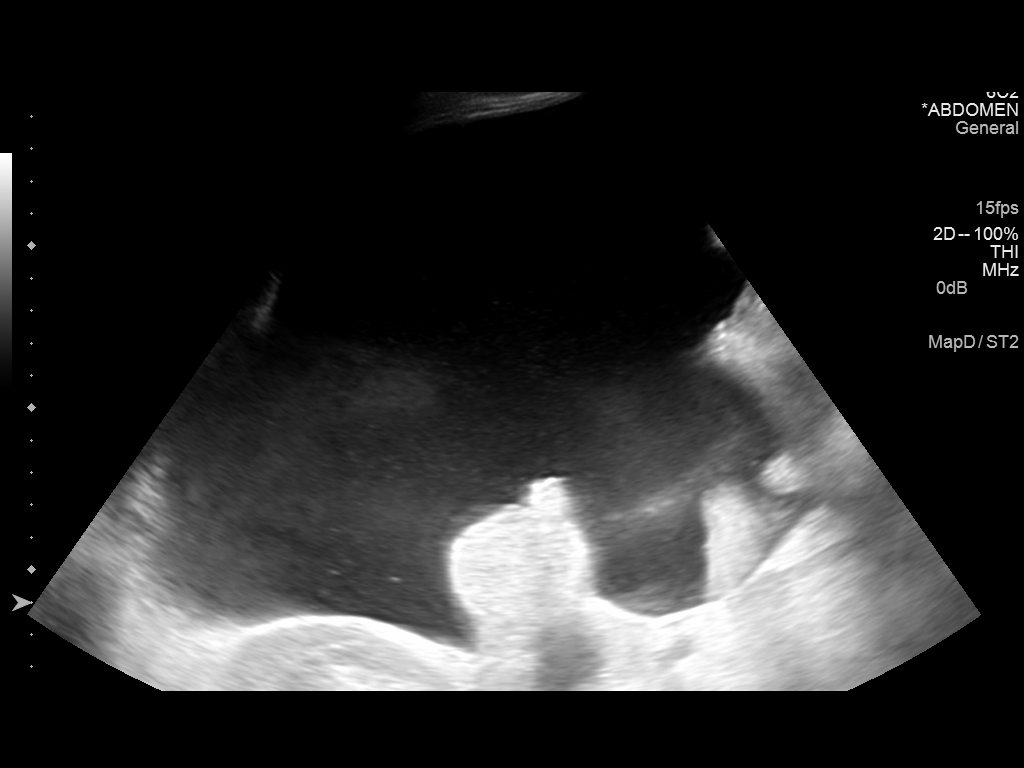
[im 3/5]
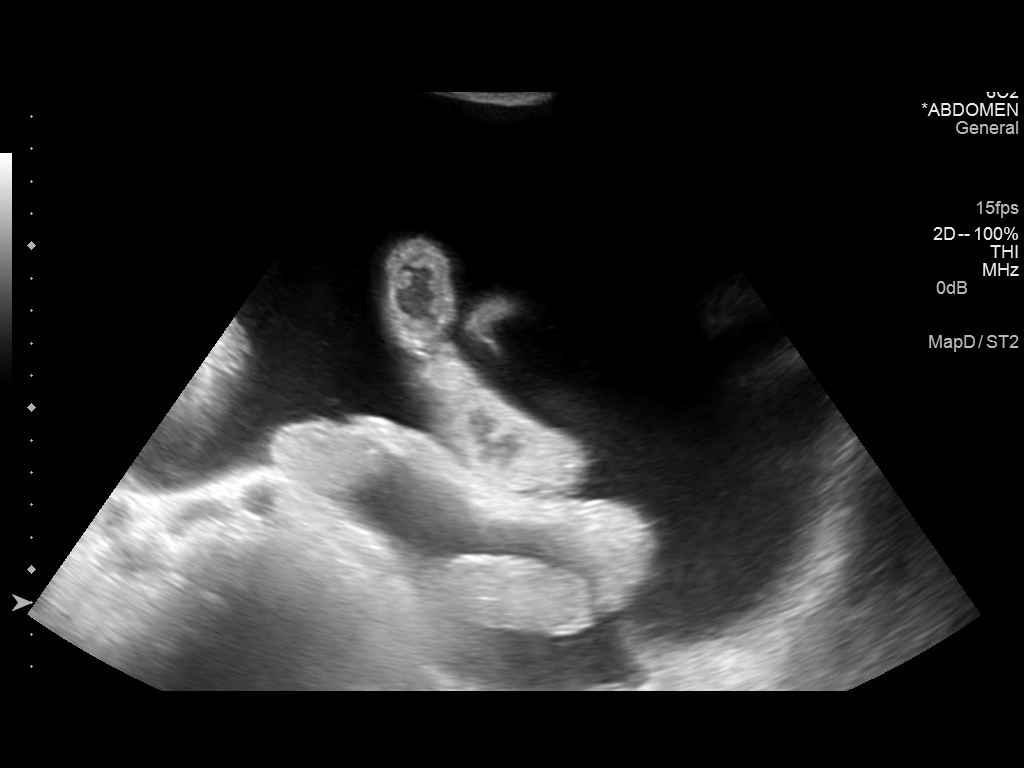
[im 4/5]
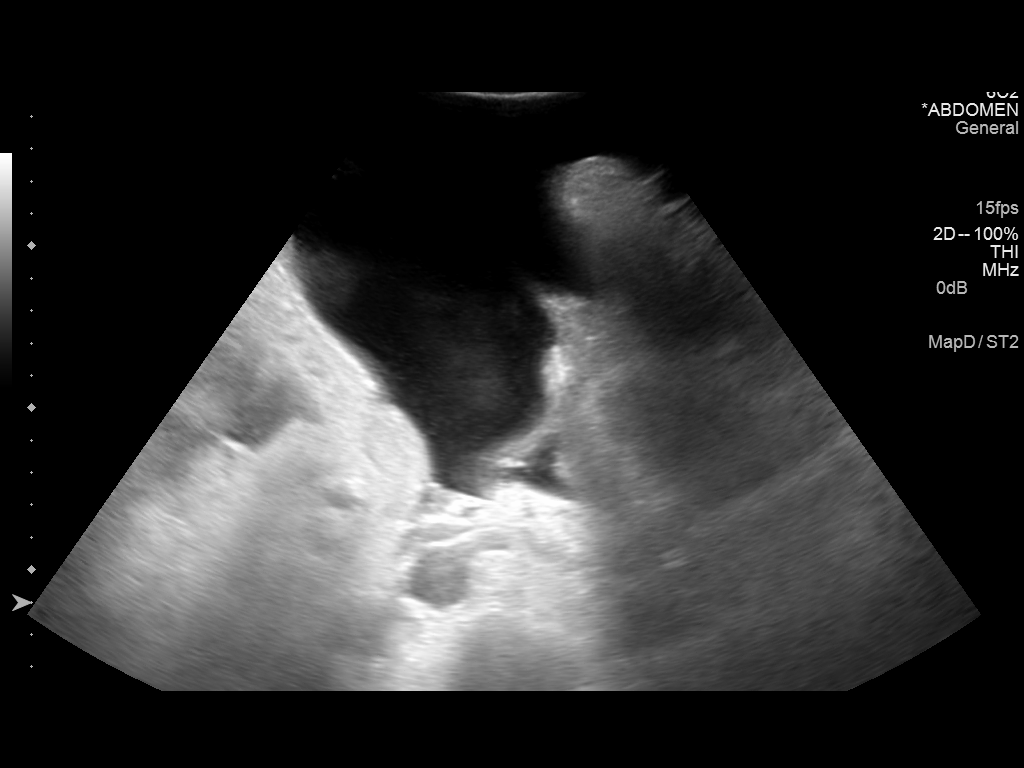
[im 5/5]
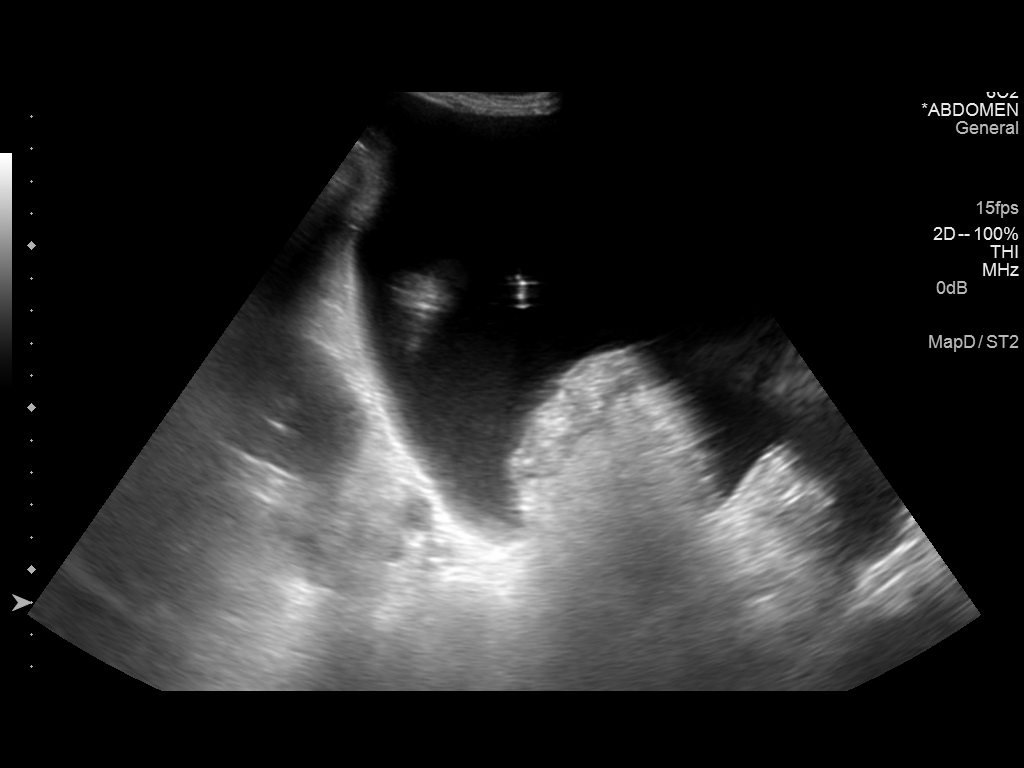

[5 of 5 positions shown; findings below may reference images not displayed]

PROCEDURE:
An ultrasound guided paracentesis was thoroughly discussed with the
patient and questions answered. The benefits, risks, alternatives
and complications were also discussed. The patient understands and
wishes to proceed with the procedure. Written consent was obtained.

Ultrasound was performed to localize and mark an adequate pocket of
fluid in the right lower quadrant of the abdomen. The area was then
prepped and draped in the normal sterile fashion. 1% Lidocaine was
used for local anesthesia. Under ultrasound guidance a 19 gauge Yueh
catheter was introduced. Paracentesis was performed. The catheter
was removed and a dressing applied.

COMPLICATIONS:
None immediate
FINDINGS: A total of approximately 4 L of clear yellow fluid was removed. A
fluid sample was not sent for laboratory analysis.
IMPRESSION: Successful ultrasound guided paracentesis yielding 4 L of ascites.

Read by Lo Re Ramo

## 2014-10-05 IMAGING — US US ABDOMEN LIMITED
1 series · 4 of 4 positions shown · non-contrast
Comparison: 11/12/2013.

CLINICAL DATA: Ascites.

EXAM:
LIMITED ABDOMEN ULTRASOUND FOR ASCITES
TECHNIQUE: Limited ultrasound survey for ascites was performed in all four
abdominal quadrants.

[Series 1: us abdomen limited · 0.28mm/px · 4 of 4 slices shown]
[im 1/4]
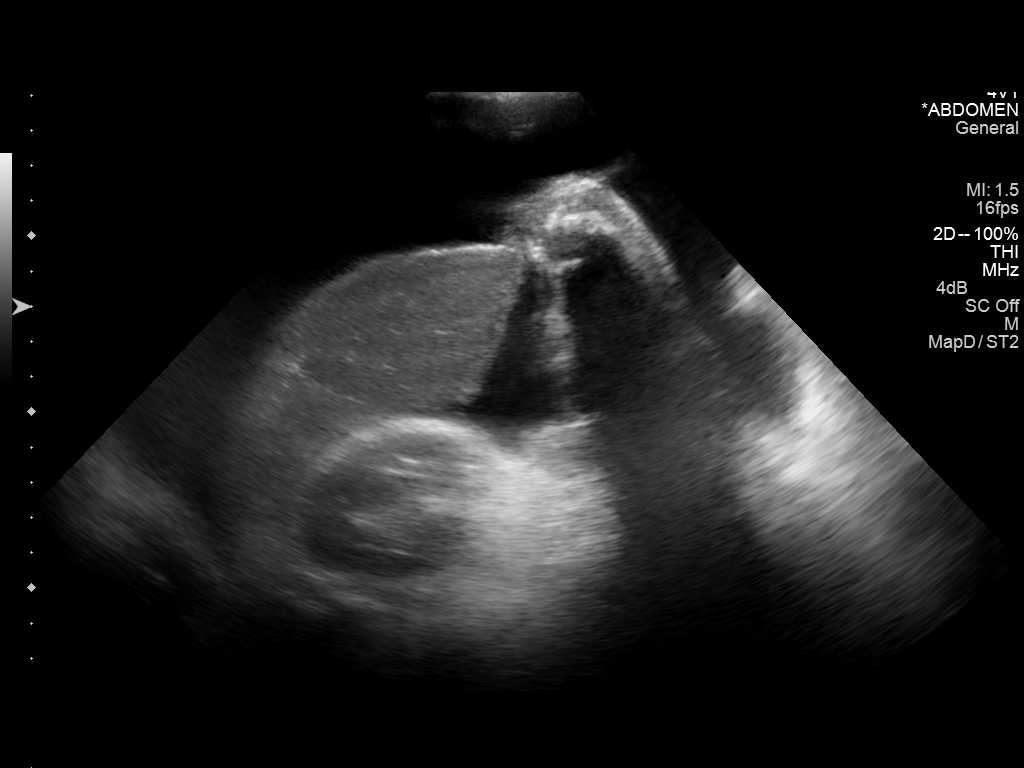
[im 2/4]
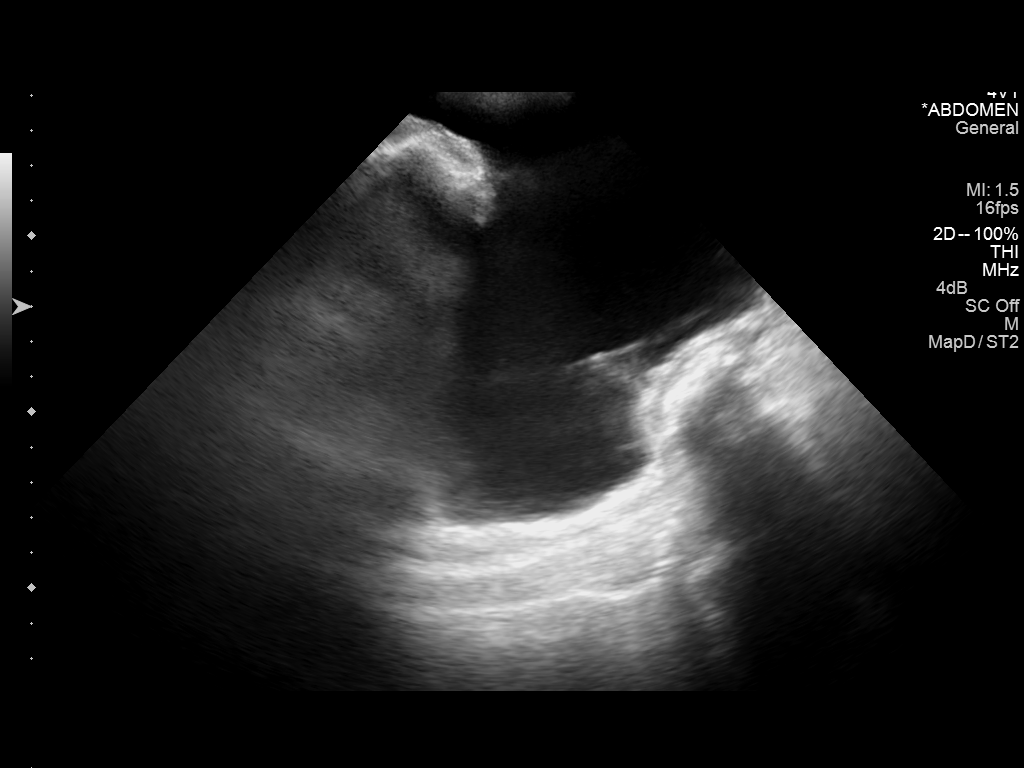
[im 3/4]
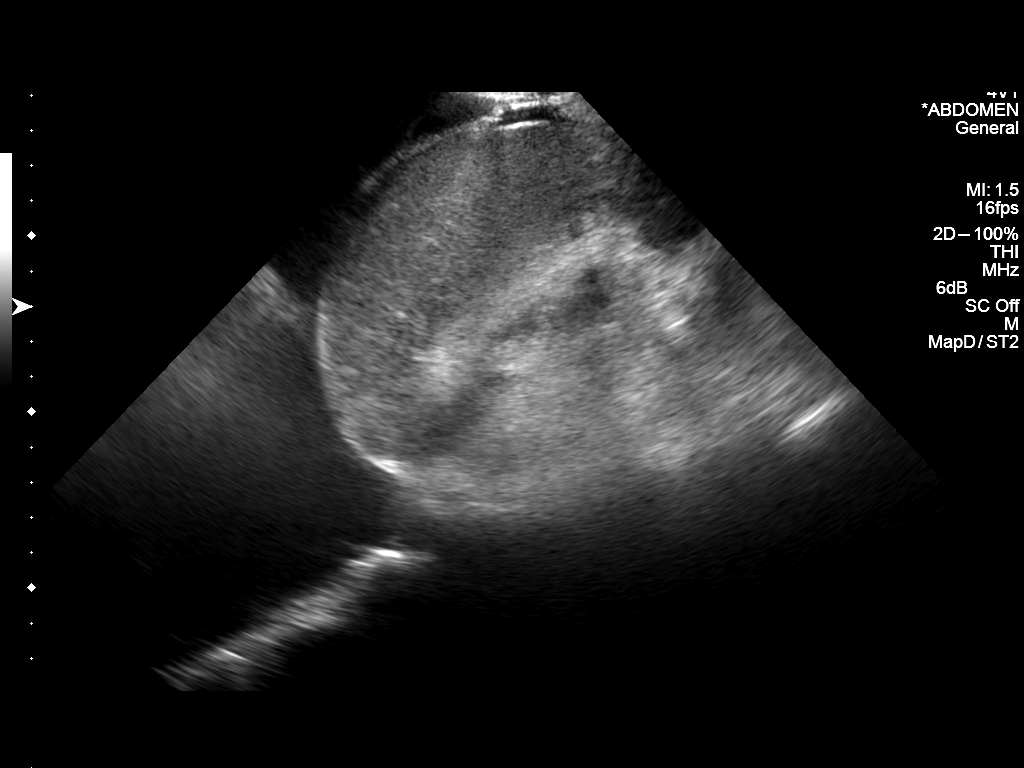
[im 4/4]
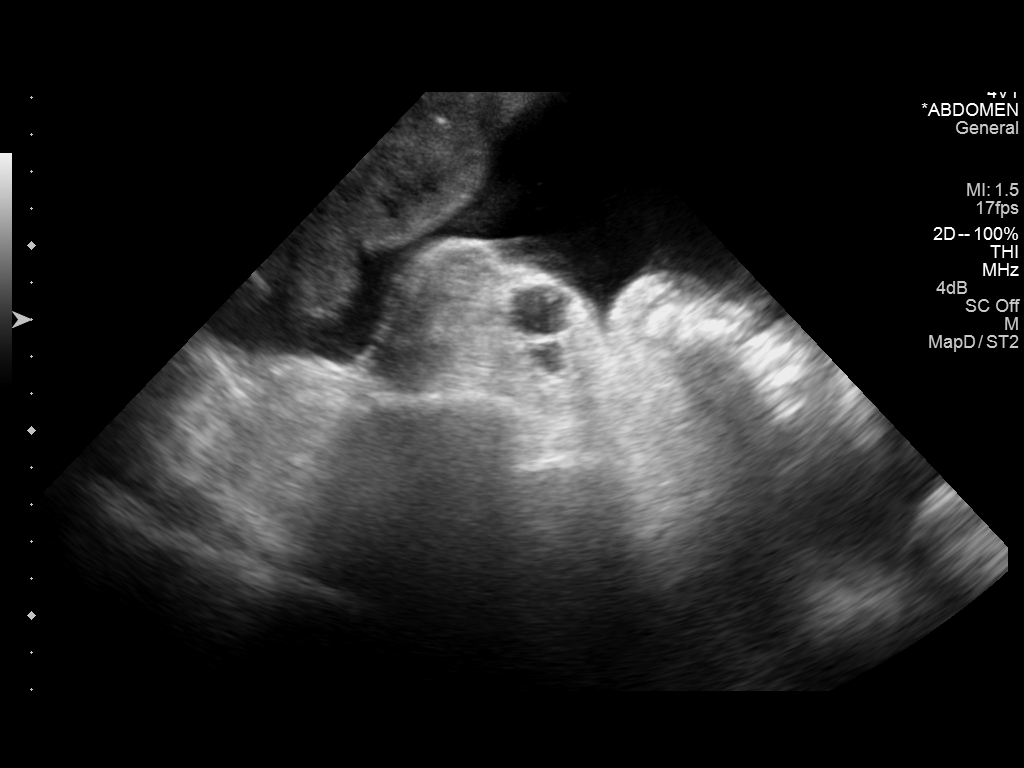

[4 of 4 positions shown; findings below may reference images not displayed]

FINDINGS: Small amount of ascites present. No other focal abnormality
identified.
IMPRESSION: Small amount of ascites.
